# Patient Record
Sex: Male | Born: 1955 | Race: Black or African American | Hispanic: No | Marital: Married | Smoking: Never smoker
Health system: Southern US, Community
[De-identification: ages and names within clinical notes are randomized; demographics above are authoritative.]

## PROBLEM LIST (undated history)

## (undated) ENCOUNTER — Emergency Department (HOSPITAL_BASED_OUTPATIENT_CLINIC_OR_DEPARTMENT_OTHER): Admission: EM | Payer: Self-pay | Source: Home / Self Care

## (undated) DIAGNOSIS — I10 Essential (primary) hypertension: Secondary | ICD-10-CM

## (undated) DIAGNOSIS — M1611 Unilateral primary osteoarthritis, right hip: Secondary | ICD-10-CM

## (undated) DIAGNOSIS — E119 Type 2 diabetes mellitus without complications: Secondary | ICD-10-CM

## (undated) HISTORY — PX: PROSTATE BIOPSY: SHX241

## (undated) HISTORY — PX: WISDOM TOOTH EXTRACTION: SHX21

---

## 2013-07-20 DIAGNOSIS — E785 Hyperlipidemia, unspecified: Secondary | ICD-10-CM | POA: Insufficient documentation

## 2014-08-18 HISTORY — PX: LAMINECTOMY: SHX219

## 2017-03-10 DIAGNOSIS — G4733 Obstructive sleep apnea (adult) (pediatric): Secondary | ICD-10-CM | POA: Insufficient documentation

## 2021-03-27 LAB — EXTERNAL GENERIC LAB PROCEDURE: COLOGUARD: NEGATIVE

## 2021-03-27 LAB — COLOGUARD: COLOGUARD: NEGATIVE

## 2021-07-05 ENCOUNTER — Other Ambulatory Visit: Payer: Self-pay | Admitting: Urology

## 2021-07-05 DIAGNOSIS — R972 Elevated prostate specific antigen [PSA]: Secondary | ICD-10-CM

## 2021-08-17 ENCOUNTER — Ambulatory Visit
Admission: RE | Admit: 2021-08-17 | Discharge: 2021-08-17 | Disposition: A | Discharge status: Home / Self Care | Payer: Self-pay | Source: Ambulatory Visit | Attending: Urology | Admitting: Urology

## 2021-08-17 ENCOUNTER — Other Ambulatory Visit: Payer: Self-pay

## 2021-08-17 DIAGNOSIS — R972 Elevated prostate specific antigen [PSA]: Secondary | ICD-10-CM

## 2021-08-17 IMAGING — MR MR PROSTATE WO/W CM
12 series · 48 of 48 positions shown · IV contrast (multihance)
Comparison: None

CLINICAL DATA: A 65-year-old male presents for evaluation of
elevated PSA to

EXAM:
MR PROSTATE WITHOUT AND WITH CONTRAST
TECHNIQUE: Multiplanar multisequence MRI images were obtained of the pelvis
centered about the prostate. Pre and post contrast images were
obtained.
CONTRAST:  20mL MULTIHANCE GADOBENATE DIMEGLUMINE 529 MG/ML IV SOLN

[Series 3: T2 · coronal · 3.0mm · 0.56mm/px · 1 of 27 slices shown (1 of 3)]
[im 1/27]
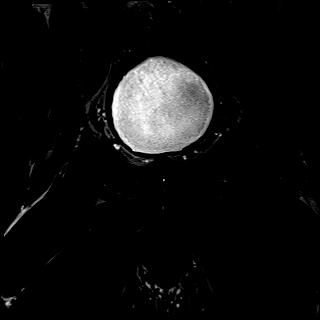

[Series 4: T1 · axial · 5.0mm · 1.25mm/px · 1 of 80 slices shown]
[im 1/80]
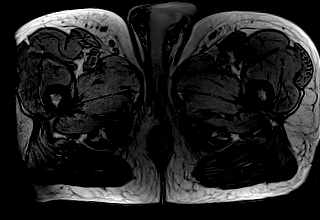

[Series 5: DWI · axial · 3.0mm · 1.93mm/px · 1 of 81 slices shown (1 of 3)]
[im 1/81]
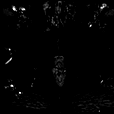

[Series 6: DWI · axial · 3.0mm · 1.93mm/px · 1 of 27 slices shown (2 of 3)]
[im 1/27]
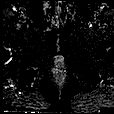

[Series 7: DWI · axial · 3.0mm · 1.93mm/px · 1 of 27 slices shown (3 of 3)]
[im 1/27]
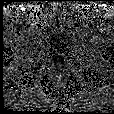

[Series 8: T2 · axial · 3.0mm · 0.62mm/px · 1 of 28 slices shown (2 of 3)]
[im 1/28]
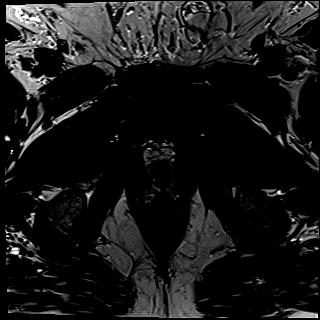

[Series 9: T2 · axial · 1.0mm · 1.04mm/px · z∈[+56,+134]mm · 2 of 80 slices shown (3 of 3)]
[im 1/80]
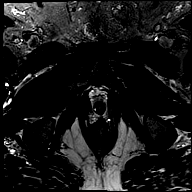
[im 80/80]
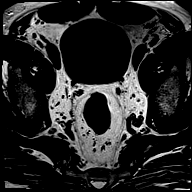

[Series 10: pre t1_twist_tra_dyn · axial · non-contrast · 3.5mm · 0.94mm/px · 1 of 24 slices shown]
[im 1/24]
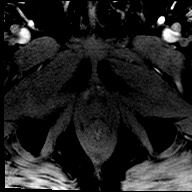

[Series 11: post t1_twist_tra_dyn-copy center · axial · non-contrast · 3.5mm · 0.94mm/px · z∈[+54,+134]mm · 18 of 720 slices shown]
[im 1/720]
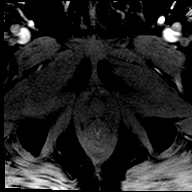
[im 43/720]
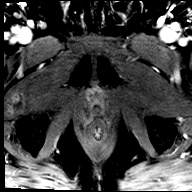
[im 85/720]
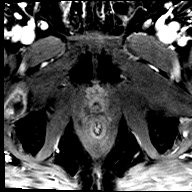
[im 127/720]
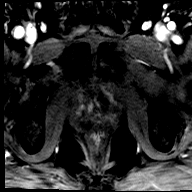
[im 170/720]
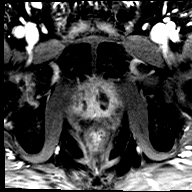
[im 212/720]
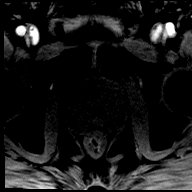
[im 254/720]
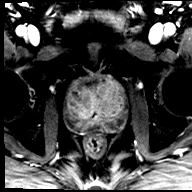
[im 297/720]
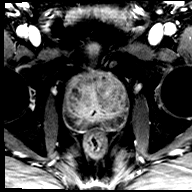
[im 339/720]
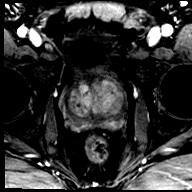
[im 381/720]
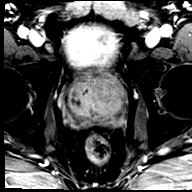
[im 423/720]
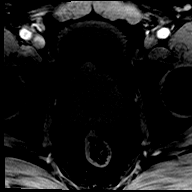
[im 466/720]
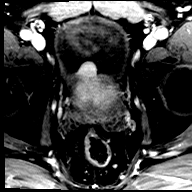
[im 508/720]
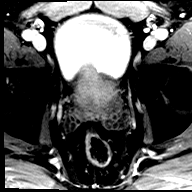
[im 550/720]
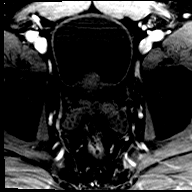
[im 593/720]
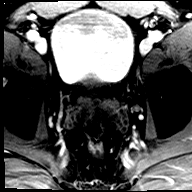
[im 635/720]
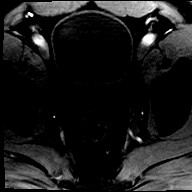
[im 677/720]
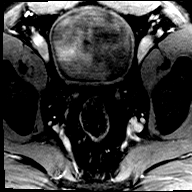
[im 720/720]
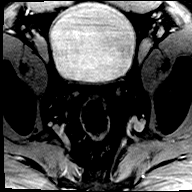

[Series 12: post t1_twist_tra_dyn-copy cent_sub · axial · 3.5mm · 0.94mm/px · z∈[+54,+134]mm · 17 of 696 slices shown]
[im 1/696]
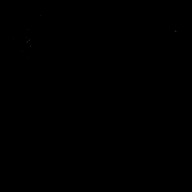
[im 44/696]
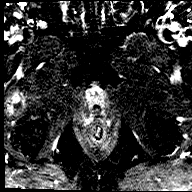
[im 87/696]
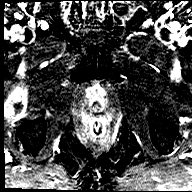
[im 131/696]
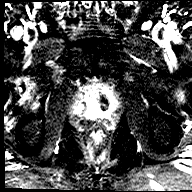
[im 174/696]
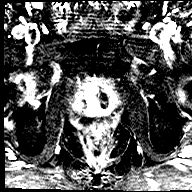
[im 218/696]
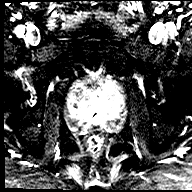
[im 261/696]
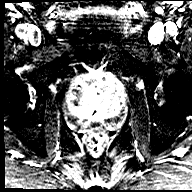
[im 305/696]
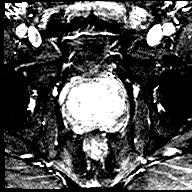
[im 348/696]
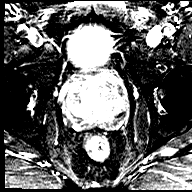
[im 391/696]
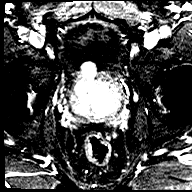
[im 435/696]
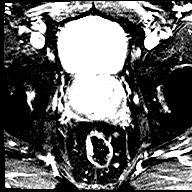
[im 478/696]
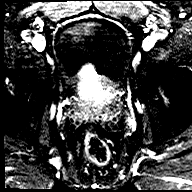
[im 522/696]
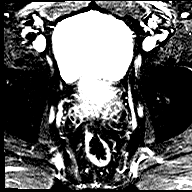
[im 565/696]
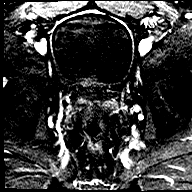
[im 609/696]
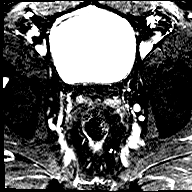
[im 652/696]
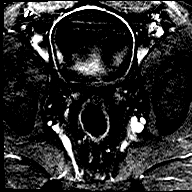
[im 696/696]
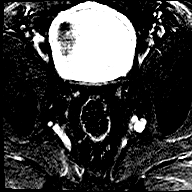

[Series 13: t1_vibe_dixon_tra_f · axial · 2.5mm · 0.91mm/px · z∈[+15,+212]mm · 2 of 80 slices shown]
[im 1/80]
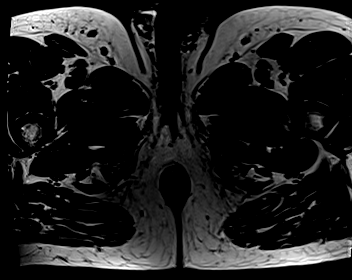
[im 80/80]
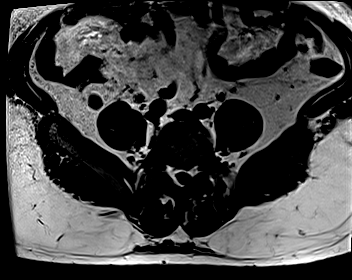

[Series 14: t1_vibe_dixon_tra_w · axial · 2.5mm · 0.91mm/px · z∈[+15,+212]mm · 2 of 80 slices shown]
[im 1/80]
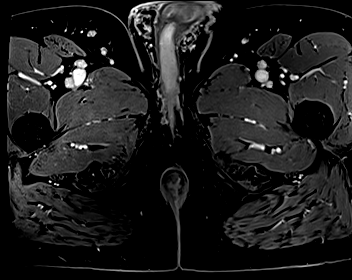
[im 80/80]
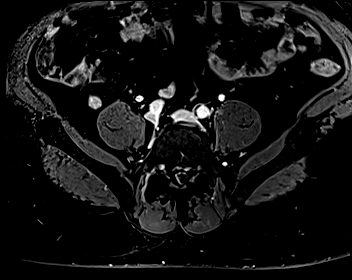

[48 of 48 positions shown; findings below may reference images not displayed]

FINDINGS: Prostate: Signs of BPH in prostatomegaly.

Transitional zone: BPH nodularity throughout the transitional zone.
No high-risk lesion.

Peripheral zone: Linear and wedge-shaped areas of T2 hypointensity
throughout the transitional zone. Tiny area of T2 hypointensity
correspondingly dark on the ADC map at 4 mm size showing signal on
"high B value" restricted diffusion images (image [DATE]). This is in
the LEFT posterolateral mid gland to base peripheral zone. PIRADS
category 4

Volume: 6.0 x 4.9 x 6.3 cm. Calculated volume from DynaCAD at 99 cc.

Transcapsular spread:  Absent

Seminal vesicle involvement: Absent

Neurovascular bundle involvement: Absent

Pelvic adenopathy: Absent

Bone metastasis: Absent

Other findings: None
IMPRESSION: 1. 4 mm area of T2 hypointensity correspondingly dark on the ADC map
at the 4 mm size with associated restricted diffusion. PIRADS
category 4 lesion.
2. No evidence of extracapsular extension, seminal vesicle invasion,
or metastatic disease.

## 2021-08-17 MED ORDER — GADOBENATE DIMEGLUMINE 529 MG/ML IV SOLN
20.0000 mL | Freq: Once | INTRAVENOUS | Status: AC | PRN
  Administered 2021-08-17: 20 mL via INTRAVENOUS

## 2023-01-20 ENCOUNTER — Emergency Department (HOSPITAL_BASED_OUTPATIENT_CLINIC_OR_DEPARTMENT_OTHER)
Admission: EM | Admit: 2023-01-20 | Discharge: 2023-01-20 | Disposition: A | Discharge status: Home / Self Care | Payer: 59 | Attending: Emergency Medicine | Admitting: Emergency Medicine

## 2023-01-20 ENCOUNTER — Other Ambulatory Visit: Payer: Self-pay

## 2023-01-20 ENCOUNTER — Encounter (HOSPITAL_BASED_OUTPATIENT_CLINIC_OR_DEPARTMENT_OTHER): Payer: Self-pay

## 2023-01-20 DIAGNOSIS — M545 Low back pain, unspecified: Secondary | ICD-10-CM

## 2023-01-20 DIAGNOSIS — I10 Essential (primary) hypertension: Secondary | ICD-10-CM | POA: Insufficient documentation

## 2023-01-20 HISTORY — DX: Essential (primary) hypertension: I10

## 2023-01-20 MED ORDER — PREDNISONE 10 MG (21) PO TBPK: ORAL_TABLET | Freq: Every day | ORAL | 0 refills | Status: DC

## 2023-01-20 MED ORDER — KETOROLAC TROMETHAMINE 30 MG/ML IJ SOLN
30.0000 mg | Freq: Once | INTRAMUSCULAR | Status: AC
  Administered 2023-01-20: 30 mg via INTRAMUSCULAR
  Filled 2023-01-20: qty 1

## 2023-01-20 MED ORDER — LIDOCAINE 5 % EX PTCH
1.0000 | MEDICATED_PATCH | CUTANEOUS | Status: DC
  Administered 2023-01-20: 1 via TRANSDERMAL
  Filled 2023-01-20: qty 1

## 2023-01-20 MED ORDER — LIDOCAINE 5 % EX PTCH: 1.0000 | MEDICATED_PATCH | CUTANEOUS | 0 refills | Status: DC

## 2023-01-20 MED ORDER — METHOCARBAMOL 500 MG PO TABS: 500.0000 mg | ORAL_TABLET | Freq: Two times a day (BID) | ORAL | 0 refills | Status: DC

## 2023-01-20 NOTE — ED Triage Notes (Signed)
Patient here POV from Home.  Endorses Back for about 5 Days. Left Lower Back mainly. No Known Trauma. No Dysuria. No Hematuria. Radiates to left leg.   NAD Noted during Triage. A&Ox4. GCS 15. Ambulatory.

## 2023-01-20 NOTE — Discharge Instructions (Addendum)
I have given you the information for our neurosurgery specialists. As we discussed, I do believe that a multimodal approach will be the most effective for treating her back pain.  Topical medicine such as Voltaren gel, Lidoderm patch, muscle relaxers like Robaxin, prednisone and Tylenol and ibuprofen.  It might not be unreasonable to take a Pepcid 20 mg once daily or twice daily to offer you some stomach protection with you being on prednisone and taking NSAIDs.  Please use Tylenol or ibuprofen for pain.  You may use 600 mg ibuprofen every 6 hours or 1000 mg of Tylenol every 6 hours.  You may choose to alternate between the 2.  This would be most effective.  Not to exceed 4 g of Tylenol within 24 hours.  Not to exceed 3200 mg ibuprofen 24 hours.   Please return the emergency room for any new or concerning symptoms.

## 2023-01-20 NOTE — ED Provider Notes (Signed)
[place] EMERGENCY DEPARTMENT AT [facility] Provider Note   CSN: [id number] Arrival date & time: 01/20/23  1210     History  Chief Complaint  Patient presents with   Back Pain    Billy Dalton is a 67 y.o. male.   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  No falls or trauma he states that it seems to be worse when he was bending over and walking.  He states the pain is better when he sits down.  He denies any numbness or weakness in his leg other than some weakness related to pain.  Denies any chest pain or difficulty breathing no calf swelling or pain.     Home Medications Prior to Admission medications   Medication Sig Start Date End Date Taking? Authorizing Provider  lidocaine (LIDODERM) 5 % Place 1 patch onto the skin daily. Remove & Discard patch within 12 hours or as directed by MD 01/20/23  Yes Fitzpatrick Alberico, Rodrigo Ran, PA  methocarbamol (ROBAXIN) 500 MG tablet Take 1 tablet (500 mg total) by mouth 2 (two) times daily. 01/20/23  Yes Asah Lamay S, PA  predniSONE (STERAPRED UNI-PAK 21 TAB) 10 MG (21) TBPK tablet Take by mouth daily. Take 6 tabs by mouth daily  for 2 days, then 5 tabs for 2 days, then 4 tabs for 2 days, then 3 tabs for 2 days, 2 tabs for 2 days, then 1 tab by mouth daily for 2 days 01/20/23  Yes Solon Augusta S, PA      Allergies    Patient has no known allergies.    Review of Systems   Review of Systems  Musculoskeletal:  Positive for back pain.    Physical Exam Updated  Vital Signs BP (!) 144/68 (BP Location: Right Arm)   Pulse 79   Temp 97.7 F (36.5 C)   Resp 18   Ht 5\' 8"  (1.727 m)   Wt 101.6 kg   SpO2 98%   BMI 34.06 kg/m  Physical Exam Vitals and nursing note reviewed.  Constitutional:      General: He is not in acute distress. HENT:     Head: Normocephalic and atraumatic.     Nose: Nose normal.     Mouth/Throat:     Mouth: Mucous membranes are moist.  Eyes:     General: No scleral icterus. Cardiovascular:     Rate and Rhythm: Normal rate and regular rhythm.     Pulses: Normal pulses.     Heart sounds: Normal heart sounds.     Comments: Pulses 2+ in LLE Pulmonary:     Effort: Pulmonary effort is normal. No respiratory distress.     Breath sounds: No wheezing.  Abdominal:     Palpations: Abdomen is soft.     Tenderness: There is no abdominal tenderness. There is no guarding or rebound.  Musculoskeletal:     Cervical back: Normal range of motion.     Right lower leg:  No edema.     Left lower leg: No edema.     Comments: No midline C, T, L-spine tenderness there is some muscular tenderness of the left paravertebral musculature.  Negative straight leg raise  Skin:    General: Skin is warm and dry.     Capillary Refill: Capillary refill takes less than 2 seconds.  Neurological:     Mental Status: He is alert. Mental status is at baseline.  Psychiatric:        Mood and Affect: Mood normal.        Behavior: Behavior normal.     ED Results / Procedures / Treatments   Labs (all labs ordered are listed, but only abnormal results are displayed) Labs Reviewed - No data to display  EKG None  Radiology No results found.  Procedures Procedures    Medications Ordered in ED Medications  lidocaine (LIDODERM) 5 % 1 patch (has no administration in time range)  ketorolac (TORADOL) 30 MG/ML injection 30 mg (has no administration in time range)    ED Course/ Medical Decision Making/ A&P Clinical Course as of 01/20/23 1439  Tue  Jan 20, 2023  1411 Started may 31st. Has hx of similar issues.  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  No falls or trauma he states that it seems to be worse when he was bending over and walking.  He states the pain is better when he sits down.  He denies any numbness or weakness in his leg other than some weakness related to pain.  Denies any chest pain or difficulty breathing no calf swelling or pain.  Broad differential for back pain considered includes malignancy, disc herniation, spinal epidural abscess, spinal fracture, cauda equina, pyelonephritis, kidney stone, AAA, AD, pancreatitis, PE and PTX.   History without symptoms of urinary or stool retention or incontinence, neurologic changes such as sensation change or weakness lower extremities, coagulopathy or blood thinner use, is not elderly or with history of osteoporosis, denies any history of cancer, fever, IV drug use, weight changes (unexplained), or prolonged steroid use.   Physical exam most consistent with sciatic nerve pain vs muscle issue.  Doubt cauda equina d/t lack of saddle anesthesia/bowel or bladder incontinence or urinary retention, normal gait and reassuring physical examination without neurologic deficits.   History is not supportive of kidney stone, AAA, AD, pancreatitis, PE or PTX. Patient has no CVA tenderness or urinary sx to suggest  pyelonephritis or kidney stone.   Will manage patient conservatively at this time. NSAIDs, back exercises/stretches, heat therapy and follow up with PCP if symptoms do not resolve in 3-4 weeks. Patient offered muscle relaxer for comfort at night. Counseled on need to return to ED  for fever, worsening or concerning symptoms. Patient agreeable to plan and states understanding of follow up plans and return precautions.      Vitals WNL at time of discharge and patient is uncomfortable but in no acute distress   Offered Toradol shot -- given   Will discharge home.  Neurosurgery contact information given.   Final Clinical Impression(s) / ED Diagnoses Final diagnoses:  Left low back pain, unspecified chronicity, unspecified whether sciatica present    Rx / DC Orders ED Discharge Orders          Ordered    lidocaine (LIDODERM) 5 %  Every 24 hours        01/20/23 1433    methocarbamol (ROBAXIN) 500 MG tablet  2 times daily        01/20/23 1433    predniSONE (STERAPRED UNI-PAK 21 TAB) 10 MG (21) TBPK tablet  Daily        01/20/23 1433              Gailen Shelter, [place] 01/20/23 1440    Elayne Snare K, DO 01/20/23 1554

## 2023-10-01 ENCOUNTER — Ambulatory Visit (INDEPENDENT_AMBULATORY_CARE_PROVIDER_SITE_OTHER): Payer: PRIVATE HEALTH INSURANCE

## 2023-10-01 ENCOUNTER — Encounter: Payer: Self-pay | Admitting: Orthopaedic Surgery

## 2023-10-01 ENCOUNTER — Ambulatory Visit (INDEPENDENT_AMBULATORY_CARE_PROVIDER_SITE_OTHER): Payer: Self-pay | Admitting: Orthopaedic Surgery

## 2023-10-01 DIAGNOSIS — M25551 Pain in right hip: Secondary | ICD-10-CM

## 2023-10-01 DIAGNOSIS — M1611 Unilateral primary osteoarthritis, right hip: Secondary | ICD-10-CM

## 2023-10-01 NOTE — Progress Notes (Signed)
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  He is having a harder time with his ADLs such as putting his shoes and socks on he walks a significant limp.  It is now affecting his posture.  Another cardiologist is referred to me to discuss hip replacement surgery.  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  He does report lateral right hip pain as well as groin pain.  He does report that he is newly diagnosed diabetic but has a hemoglobin A1c of below 7.  He is work on weight loss and activity modification and has lost about 10 to 15 pounds recently.  He does have high blood pressure.  He has a history of a cardiac cath as well.  He currently denies any headache, chest pain, shortness of breath, fever, chills, nausea, vomiting.  He walks with a Trendelenburg gait.  He has a significant limp to his gait.  His right hip has significant limitations with internal and external rotation with stiffness as well as pain in the groin.  His left hip moves normally and smoothly.  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  There is flattening of the femoral head with superior lateral joint space narrowing and osteophytes around the right hip.  The left hip just shows some mild arthritis.  We had a long and thorough discussion about his right hip.  We have recommended a total hip arthroplasty through an anterior approach.  I showed him a hip replacement model and a handout about hip replacement surgery.  We discussed what to expect from an intraoperative and postoperative standpoint.  We discussed the risk and benefits of the surgery in detail.  He does wish to get on the schedule soon.  He understands that we will let him see patients again and perform procedures in as well as 2 to 4 weeks depending on his recovery.  All questions and  concerns were answered and addressed.  Will work on getting him scheduled for a right total hip replacement.

## 2023-10-05 ENCOUNTER — Telehealth: Payer: Self-pay

## 2023-10-05 NOTE — Telephone Encounter (Signed)
 I returned patient's call to discuss scheduling THA.  Left voice mail message for return call.

## 2023-10-08 ENCOUNTER — Other Ambulatory Visit: Payer: Self-pay

## 2023-10-12 ENCOUNTER — Ambulatory Visit: Payer: PRIVATE HEALTH INSURANCE | Admitting: Urgent Care

## 2023-10-12 ENCOUNTER — Ambulatory Visit: Payer: PRIVATE HEALTH INSURANCE | Admitting: Orthopaedic Surgery

## 2023-10-15 NOTE — Pre-Procedure Instructions (Signed)
 Surgical Instructions   Your procedure is scheduled on October 22, 2023. Report to [facility] Main Entrance "A" at 8:00 A.M., then check in with the Admitting office. Any questions or running late day of surgery: call [phone]  Questions prior to your surgery date: call [phone], Monday-Friday, 8am-4pm. If you experience any cold or flu symptoms such as cough, fever, chills, shortness of breath, etc. between now and your scheduled surgery, please notify us at the above number.     Remember:  Do not eat after midnight the night before your surgery  You may drink clear liquids until 7:00 AM the morning of your surgery.   Clear liquids allowed are: Water, Non-Citrus Juices (without pulp), Carbonated Beverages, Clear Tea (no milk, honey, etc.), Black Coffee Only (NO MILK, CREAM OR POWDERED CREAMER of any kind), and Gatorade.  Patient Instructions  The night before surgery:  No food after midnight. ONLY clear liquids after midnight  The day of surgery (if you have diabetes): Drink ONE (1) 12 oz G2 given to you in your pre admission testing appointment by 7:00 AM the morning of surgery. Drink in one sitting. Do not sip.  This drink was given to you during your hospital  pre-op appointment visit.  Nothing else to drink after completing the  12 oz bottle of G2.         If you have questions, please contact your surgeon's office.   Take these medicines the morning of surgery with A SIP OF WATER: metoprolol succinate (TOPROL-XL)    Follow your surgeon's instructions on when to stop Aspirin.  If no instructions were given by your surgeon then you will need to call the office to get those instructions.     One week prior to surgery, STOP taking any Aleve, Naproxen, Ibuprofen, Motrin, Advil, Goody's, BC's, all herbal medications, fish oil, and non-prescription vitamins.   WHAT DO I DO ABOUT MY DIABETES MEDICATION?   Do not take metFORMIN (GLUCOPHAGE-XR) the morning of  surgery.   HOW TO MANAGE YOUR DIABETES BEFORE AND AFTER SURGERY  Why is it important to control my blood sugar before and after surgery? Improving blood sugar levels before and after surgery helps healing and can limit problems. A way of improving blood sugar control is eating a healthy diet by:  Eating less sugar and carbohydrates  Increasing activity/exercise  Talking with your doctor about reaching your blood sugar goals High blood sugars (greater than 180 mg/dL) can raise your risk of infections and slow your recovery, so you will need to focus on controlling your diabetes during the weeks before surgery. Make sure that the doctor who takes care of your diabetes knows about your planned surgery including the date and location.  How do I manage my blood sugar before surgery? Check your blood sugar at least 4 times a day, starting 2 days before surgery, to make sure that the level is not too high or low.  Check your blood sugar the morning of your surgery when you wake up and every 2 hours until you get to the Short Stay unit.  If your blood sugar is less than 70 mg/dL, you will need to treat for low blood sugar: Do not take insulin. Treat a low blood sugar (less than 70 mg/dL) with  cup of clear juice (cranberry or apple), 4 glucose tablets, OR glucose gel. Recheck blood sugar in 15 minutes after treatment (to make sure it is greater than 70 mg/dL). If your blood sugar is  not greater than 70 mg/dL on recheck, call [phone] for further instructions. Report your blood sugar to the short stay nurse when you get to Short Stay.  If you are admitted to the hospital after surgery: Your blood sugar will be checked by the staff and you will probably be given insulin after surgery (instead of oral diabetes medicines) to make sure you have good blood sugar levels. The goal for blood sugar control after surgery is 80-180 mg/dL.                        Do NOT Smoke (Tobacco/Vaping) for 24  hours prior to your procedure.  If you use a CPAP at night, you may bring your mask/headgear for your overnight stay.   You will be asked to remove any contacts, glasses, piercing's, hearing aid's, dentures/partials prior to surgery. Please bring cases for these items if needed.    Patients discharged the day of surgery will not be allowed to drive home, and someone needs to stay with them for 24 hours.  SURGICAL WAITING ROOM VISITATION Patients may have no more than 2 support people in the waiting area - these visitors may rotate.   Pre-op nurse will coordinate an appropriate time for 1 ADULT support person, who may not rotate, to accompany patient in pre-op.  Children under the age of 51 must have an adult with them who is not the patient and must remain in the main waiting area with an adult.  If the patient needs to stay at the hospital during part of their recovery, the visitor guidelines for inpatient rooms apply.  Please refer to the [facility] website for the visitor guidelines for any additional information.   If you received a COVID test during your pre-op visit  it is requested that you wear a mask when out in public, stay away from anyone that may not be feeling well and notify your surgeon if you develop symptoms. If you have been in contact with anyone that has tested positive in the last 10 days please notify you surgeon.      Pre-operative 5 CHG Bathing Instructions   You can play a key role in reducing the risk of infection after surgery. Your skin needs to be as free of germs as possible. You can reduce the number of germs on your skin by washing with CHG (chlorhexidine gluconate) soap before surgery. CHG is an antiseptic soap that kills germs and continues to kill germs even after washing.   DO NOT use if you have an allergy to chlorhexidine/CHG or antibacterial soaps. If your skin becomes reddened or irritated, stop using the CHG and notify one of our RNs at  [phone].   Please shower with the CHG soap starting 4 days before surgery using the following schedule:     Please keep in mind the following:  DO NOT shave, including legs and underarms, starting the day of your first shower.   You may shave your face at any point before/day of surgery.  Place clean sheets on your bed the day you start using CHG soap. Use a clean washcloth (not used since being washed) for each shower. DO NOT sleep with pets once you start using the CHG.   CHG Shower Instructions:  Wash your face and private area with normal soap. If you choose to wash your hair, wash first with your normal shampoo.  After you use shampoo/soap, rinse your hair and body thoroughly to  remove shampoo/soap residue.  Turn the water OFF and apply about 3 tablespoons (45 ml) of CHG soap to a CLEAN washcloth.  Apply CHG soap ONLY FROM YOUR NECK DOWN TO YOUR TOES (washing for 3-5 minutes)  DO NOT use CHG soap on face, private areas, open wounds, or sores.  Pay special attention to the area where your surgery is being performed.  If you are having back surgery, having someone wash your back for you may be helpful. Wait 2 minutes after CHG soap is applied, then you may rinse off the CHG soap.  Pat dry with a clean towel  Put on clean clothes/pajamas   If you choose to wear lotion, please use ONLY the CHG-compatible lotions that are listed below.  Additional instructions for the day of surgery: DO NOT APPLY any lotions, deodorants, cologne, or perfumes.   Do not bring valuables to the hospital. [facility] is not responsible for any belongings/valuables. Do not wear nail polish, gel polish, artificial nails, or any other type of covering on natural nails (fingers and toes) Do not wear jewelry or makeup Put on clean/comfortable clothes.  Please brush your teeth.  Ask your nurse before applying any prescription medications to the skin.     CHG Compatible Lotions   Aveeno Moisturizing  lotion  Cetaphil Moisturizing Cream  Cetaphil Moisturizing Lotion  Clairol Herbal Essence Moisturizing Lotion, Dry Skin  Clairol Herbal Essence Moisturizing Lotion, Extra Dry Skin  Clairol Herbal Essence Moisturizing Lotion, Normal Skin  Curel Age Defying Therapeutic Moisturizing Lotion with Alpha Hydroxy  Curel Extreme Care Body Lotion  Curel Soothing Hands Moisturizing Hand Lotion  Curel Therapeutic Moisturizing Cream, Fragrance-Free  Curel Therapeutic Moisturizing Lotion, Fragrance-Free  Curel Therapeutic Moisturizing Lotion, Original Formula  Eucerin Daily Replenishing Lotion  Eucerin Dry Skin Therapy Plus Alpha Hydroxy Crme  Eucerin Dry Skin Therapy Plus Alpha Hydroxy Lotion  Eucerin Original Crme  Eucerin Original Lotion  Eucerin Plus Crme Eucerin Plus Lotion  Eucerin TriLipid Replenishing Lotion  Keri Anti-Bacterial Hand Lotion  Keri Deep Conditioning Original Lotion Dry Skin Formula Softly Scented  Keri Deep Conditioning Original Lotion, Fragrance Free Sensitive Skin Formula  Keri Lotion Fast Absorbing Fragrance Free Sensitive Skin Formula  Keri Lotion Fast Absorbing Softly Scented Dry Skin Formula  Keri Original Lotion  Keri Skin Renewal Lotion Keri Silky Smooth Lotion  Keri Silky Smooth Sensitive Skin Lotion  Nivea Body Creamy Conditioning Oil  Nivea Body Extra Enriched Lotion  Nivea Body Original Lotion  Nivea Body Sheer Moisturizing Lotion Nivea Crme  Nivea Skin Firming Lotion  NutraDerm 30 Skin Lotion  NutraDerm Skin Lotion  NutraDerm Therapeutic Skin Cream  NutraDerm Therapeutic Skin Lotion  ProShield Protective Hand Cream  Provon moisturizing lotion  Please read over the following fact sheets that you were given.

## 2023-10-16 ENCOUNTER — Encounter (HOSPITAL_COMMUNITY): Payer: Self-pay

## 2023-10-16 ENCOUNTER — Encounter (HOSPITAL_COMMUNITY)
Admission: RE | Admit: 2023-10-16 | Discharge: 2023-10-16 | Disposition: A | Discharge status: Home / Self Care | Payer: PRIVATE HEALTH INSURANCE | Source: Ambulatory Visit | Attending: Orthopaedic Surgery | Admitting: Orthopaedic Surgery

## 2023-10-16 ENCOUNTER — Other Ambulatory Visit: Payer: Self-pay

## 2023-10-16 VITALS — BP 146/80 | HR 73 | Temp 98.5°F | Resp 18 | Ht 68.5 in | Wt 224.0 lb

## 2023-10-16 DIAGNOSIS — G4733 Obstructive sleep apnea (adult) (pediatric): Secondary | ICD-10-CM | POA: Diagnosis not present

## 2023-10-16 DIAGNOSIS — I2510 Atherosclerotic heart disease of native coronary artery without angina pectoris: Secondary | ICD-10-CM | POA: Diagnosis not present

## 2023-10-16 DIAGNOSIS — I10 Essential (primary) hypertension: Secondary | ICD-10-CM | POA: Diagnosis not present

## 2023-10-16 DIAGNOSIS — M1611 Unilateral primary osteoarthritis, right hip: Secondary | ICD-10-CM | POA: Diagnosis not present

## 2023-10-16 DIAGNOSIS — Z01818 Encounter for other preprocedural examination: Secondary | ICD-10-CM | POA: Insufficient documentation

## 2023-10-16 DIAGNOSIS — E119 Type 2 diabetes mellitus without complications: Secondary | ICD-10-CM | POA: Diagnosis not present

## 2023-10-16 DIAGNOSIS — Z8249 Family history of ischemic heart disease and other diseases of the circulatory system: Secondary | ICD-10-CM | POA: Insufficient documentation

## 2023-10-16 HISTORY — DX: Type 2 diabetes mellitus without complications: E11.9

## 2023-10-16 HISTORY — DX: Unilateral primary osteoarthritis, right hip: M16.11

## 2023-10-16 LAB — BASIC METABOLIC PANEL
Anion gap: 8 (ref 5–15)
BUN: 11 mg/dL (ref 8–23)
CO2: 24 mmol/L (ref 22–32)
Calcium: 9 mg/dL (ref 8.9–10.3)
Chloride: 105 mmol/L (ref 98–111)
Creatinine, Ser: 0.91 mg/dL (ref 0.61–1.24)
GFR, Estimated: >60 mL/min (ref >60)
Glucose, Bld: 117 mg/dL — ABNORMAL HIGH (ref 70–99)
Potassium: 3.5 mmol/L (ref 3.5–5.1)
Sodium: 137 mmol/L (ref 135–145)

## 2023-10-16 LAB — SURGICAL PCR SCREEN
MRSA, PCR: POSITIVE — AB
Staphylococcus aureus: POSITIVE — AB

## 2023-10-16 LAB — GLUCOSE, CAPILLARY: Glucose-Capillary: 110 mg/dL — ABNORMAL HIGH (ref 70–99)

## 2023-10-16 LAB — CBC
HCT: 44.2 % (ref 39.0–52.0)
Hemoglobin: 14.4 g/dL (ref 13.0–17.0)
MCH: 26.8 pg (ref 26.0–34.0)
MCHC: 32.6 g/dL (ref 30.0–36.0)
MCV: 82.3 fL (ref 80.0–100.0)
Platelets: 300 10*3/uL (ref 150–400)
RBC: 5.37 MIL/uL (ref 4.22–5.81)
RDW: 14.3 % (ref 11.5–15.5)
WBC: 5.4 10*3/uL (ref 4.0–10.5)
nRBC: 0 % (ref 0.0–0.2)

## 2023-10-16 LAB — HEMOGLOBIN A1C
Hgb A1c MFr Bld: 6.9 % — ABNORMAL HIGH (ref 4.8–5.6)
Mean Plasma Glucose: 151.33 mg/dL

## 2023-10-16 LAB — TYPE AND SCREEN
ABO/RH(D): A POS
Antibody Screen: NEGATIVE

## 2023-10-16 NOTE — Progress Notes (Signed)
 Notified Sherrie at Dr. Eliberto Ivory office of positive PCR results

## 2023-10-16 NOTE — Progress Notes (Signed)
 PCP - Dr. Caffie Damme Cardiologist - denies (pt did see Dr. Leslye Peer LOV 09-21-19 but states he was cleared by cardiology and no longer needs to follow up)  PPM/ICD - denies   Chest x-ray - 11/01/18 EKG - 10/16/23 Stress Test - 09/21/19- CE ECHO - 08/02/10- CE Cardiac Cath - 09/22/19- CE  Sleep Study - OSA+ CPAP - nightly, pt unsure of pressure settings  DM- Pt does not check CBG at home and does not know typical fasting levels  Last dose of GLP1 agonist-  n/a   Blood Thinner Instructions: n/a Aspirin Instructions: f/u with surgeon (pt states he stopped on his own, last dose 2/25  ERAS Protcol - yes PRE-SURGERY G2- given at PAT  COVID TEST- n/a   Anesthesia review: yes, pt did see Dr. Leslye Peer LOV 09-21-19 but states he was cleared by cardiology and no longer needs to follow up  Patient denies shortness of breath, fever, cough and chest pain at PAT appointment   All instructions explained to the patient, with a verbal understanding of the material. Patient agrees to go over the instructions while at home for a better understanding. The opportunity to ask questions was provided.

## 2023-10-19 NOTE — Progress Notes (Signed)
 Anesthesia Chart Review:  68 year old male with pertinent history including HTN, OSA on CPAP, and non-insulin-dependent DM2.  He was evaluated with cardiology in February 2021 due to strong family history of CAD.  He underwent an exercise stress test which was abnormal, however, this was followed by a coronary angiogram 09/22/2019 that showed mild to moderate nonobstructive disease.  Per result summary, "PLAN:  1.  Nonobstructive CAD  -Billy Dalton does not demonstrate an ischemic cause for his abnormal stress test with EKG abnormalities likely related to hypertensive blood pressure response.  The patient would benefit greatly from aggressive primary prevention with statin therapy given his distal left main calcification and proximal OM1 mild to moderate disease.  Otherwise, the patient will be discharged tomorrow morning with plans for outpatient follow-up as needed."  Blood pressure reasonably controlled at preop testing, 146/80.  Preop labs reviewed, unremarkable, DM2 well-controlled with A1c 6.9.  EKG 10/16/2023: NSR.  Rate 69.    Billy Dalton [facility] Short Stay Center/Anesthesiology Phone [phone] 10/19/2023 2:10 PM

## 2023-10-19 NOTE — Anesthesia Preprocedure Evaluation (Signed)
 Anesthesia Evaluation  Patient identified by MRN, date of birth, ID band Patient awake    Reviewed: Allergy & Precautions, NPO status , Patient's Chart, lab work & pertinent test results  History of Anesthesia Complications Negative for: history of anesthetic complications  Airway Mallampati: I  TM Distance: >3 FB Neck ROM: Full    Dental  (+) Dental Advisory Given, Caps   Pulmonary neg shortness of breath, sleep apnea and Continuous Positive Airway Pressure Ventilation , neg COPD, neg recent URI   Pulmonary exam normal breath sounds clear to auscultation       Cardiovascular hypertension (metoprolol, triamterene-HCTZ, verapamil), Pt. on home beta blockers and Pt. on medications (-) angina + CAD (nonobstructive)  (-) Past MI, (-) Cardiac Stents and (-) CABG (-) dysrhythmias  Rhythm:Regular Rate:Normal  HLD   Neuro/Psych negative neurological ROS     GI/Hepatic negative GI ROS, Neg liver ROS,,,  Endo/Other  diabetes (Hgb A1c 6.9), Well Controlled, Type 2, Oral Hypoglycemic Agents    Renal/GU negative Renal ROS     Musculoskeletal  (+) Arthritis , Osteoarthritis,    Abdominal  (+) + obese  Peds  Hematology negative hematology ROS (+) Lab Results      Component                Value               Date                      WBC                      5.4                 10/16/2023                HGB                      14.4                10/16/2023                HCT                      44.2                10/16/2023                MCV                      82.3                10/16/2023                PLT                      300                 10/16/2023              Anesthesia Other Findings   Reproductive/Obstetrics                             Anesthesia Physical Anesthesia Plan  ASA: 2  Anesthesia Plan: Spinal and MAC   Post-op Pain Management: Tylenol PO (pre-op)*   Induction:  Intravenous  PONV Risk Score and Plan: 1 and  Ondansetron, Dexamethasone and Treatment may vary due to age or medical condition  Airway Management Planned: Natural Airway and Simple Face Mask  Additional Equipment:   Intra-op Plan:   Post-operative Plan:   Informed Consent: I have reviewed the patients History and Physical, chart, labs and discussed the procedure including the risks, benefits and alternatives for the proposed anesthesia with the patient or authorized representative who has indicated his/her understanding and acceptance.     Dental advisory given  Plan Discussed with: CRNA and Anesthesiologist  Anesthesia Plan Comments: (I have discussed risks of neuraxial anesthesia including but not limited to infection, bleeding, nerve injury, back pain, headache, seizures, and failure of block. Patient denies bleeding disorders and is not currently anticoagulated. Labs have been reviewed. Risks and benefits discussed. All patient's questions answered.   Discussed with patient risks of MAC including, but not limited to, minor pain or discomfort, hearing people in the room, and possible need for backup general anesthesia. Risks for general anesthesia also discussed including, but not limited to, sore throat, hoarse voice, chipped/damaged teeth, injury to vocal cords, nausea and vomiting, allergic reactions, lung infection, heart attack, stroke, and death. All questions answered.   PAT note by Antionette Poles, PA-C: 68 year old male with pertinent history including HTN, OSA on CPAP, and non-insulin-dependent DM2.  He was evaluated with cardiology in February 2021 due to strong family history of CAD.  He underwent an exercise stress test which was abnormal, however, this was followed by a coronary angiogram 09/22/2019 that showed mild to moderate nonobstructive disease.  Per result summary, "PLAN: 1. Nonobstructive CAD  -ALERIC FROELICH does not demonstrate an ischemic cause for his abnormal  stress test with EKG abnormalities likely related to hypertensive blood pressure response. The patient would benefit greatly from aggressive primary prevention with statin therapy given his distal left main calcification and proximal OM1 mild to moderate disease. Otherwise, the patient will be discharged tomorrow morning with plans for outpatient follow-up as needed."  Blood pressure reasonably controlled at preop testing, 146/80.  Preop labs reviewed, unremarkable, DM2 well-controlled with A1c 6.9.  EKG 10/16/2023: NSR.  Rate 69.  )        Anesthesia Quick Evaluation

## 2023-10-20 ENCOUNTER — Telehealth: Payer: Self-pay

## 2023-10-20 NOTE — Telephone Encounter (Signed)
 Patient called concerning his lab results.  CB# [phone].  Please advise.  Thank you

## 2023-10-21 NOTE — Progress Notes (Signed)
 Pt made aware of surgery time change for 10/22/23 [phone], arrival 0530. Finish drink by 0430 and any clear liquids, no food after midnight, and to follow all other instructions given.

## 2023-10-21 NOTE — H&P (Signed)
 TOTAL HIP ADMISSION H&P  Patient is admitted for right total hip arthroplasty.  Subjective:  Chief Complaint: right hip pain  HPI: Billy Dalton., 68 y.o. male, has a history of pain and functional disability in the right hip(s) due to arthritis and patient has failed non-surgical conservative treatments for greater than 12 weeks to include NSAID's and/or analgesics and activity modification.  Onset of symptoms was gradual starting 1 years ago with gradually worsening course since that time.The patient noted no past surgery on the right hip(s).  Patient currently rates pain in the right hip at 10 out of 10 with activity. Patient has night pain, worsening of pain with activity and weight bearing, trendelenberg gait, pain that interfers with activities of daily living, and pain with passive range of motion. Patient has evidence of subchondral cysts, subchondral sclerosis, periarticular osteophytes, and joint space narrowing by imaging studies. This condition presents safety issues increasing the risk of falls.  There is no current active infection.  Patient Active Problem List   Diagnosis Date Noted   Unilateral primary osteoarthritis, right hip 10/01/2023   OSA on CPAP 03/10/2017   Hyperlipidemia 07/20/2013   Past Medical History:  Diagnosis Date   Diabetes mellitus without complication (HCC)    Hypertension    Osteoarthritis of right hip     Past Surgical History:  Procedure Laterality Date   LAMINECTOMY  08/18/2014   PROSTATE BIOPSY     yearly   WISDOM TOOTH EXTRACTION      No current facility-administered medications for this encounter.   Current Outpatient Medications  Medication Sig Dispense Refill Last Dose/Taking   Ascorbic Acid (VITAMIN C PO) Take 1 tablet by mouth every Monday, Tuesday, Wednesday, Thursday, and Friday.   Taking   atorvastatin (LIPITOR) 80 MG tablet Take 80 mg by mouth every evening.   Taking   metFORMIN (GLUCOPHAGE-XR) 500 MG 24 hr tablet Take 500 mg by  mouth every evening.   Taking   metoprolol succinate (TOPROL-XL) 25 MG 24 hr tablet Take 25 mg by mouth in the morning.   Taking   tadalafil (CIALIS) 10 MG tablet Take 10 mg by mouth daily as needed for erectile dysfunction.   Taking As Needed   tamsulosin (FLOMAX) 0.4 MG CAPS capsule Take 0.4 mg by mouth at bedtime.   Taking   tetrahydrozoline 0.05 % ophthalmic solution Place 1-2 drops into both eyes 3 (three) times daily as needed (dry/irritated eyes.).   Taking As Needed   triamterene-hydrochlorothiazide (MAXZIDE-25) 37.5-25 MG tablet Take 1 tablet by mouth in the morning.   Taking   verapamil (CALAN-SR) 240 MG CR tablet Take 240 mg by mouth at bedtime.   Taking   aspirin EC 81 MG tablet Take 81 mg by mouth daily. Swallow whole.      No Known Allergies  Social History   Tobacco Use   Smoking status: Never   Smokeless tobacco: Never  Substance Use Topics   Alcohol use: Not Currently    No family history on file.   Review of Systems  Objective:  Physical Exam Vitals reviewed.  Constitutional:      Appearance: Normal appearance.  HENT:     Head: Normocephalic and atraumatic.  Eyes:     Extraocular Movements: Extraocular movements intact.     Pupils: Pupils are equal, round, and reactive to light.  Cardiovascular:     Rate and Rhythm: Normal rate.  Pulmonary:     Effort: Pulmonary effort is normal.  Breath sounds: Normal breath sounds.  Abdominal:     Palpations: Abdomen is soft.  Musculoskeletal:     Cervical back: Normal range of motion and neck supple.     Right hip: Tenderness and bony tenderness present. Decreased range of motion. Decreased strength.  Neurological:     Mental Status: He is alert and oriented to person, place, and time.  Psychiatric:        Behavior: Behavior normal.     Vital signs in last 24 hours:    Labs:   Estimated body mass index is 33.56 kg/m as calculated from the following:   Height as of 10/16/23: 5' 8.5" (1.74 m).   Weight  as of 10/16/23: 101.6 kg.   Imaging Review Plain radiographs demonstrate severe degenerative joint disease of the right hip(s). The bone quality appears to be excellent for age and reported activity level.      Assessment/Plan:  End stage arthritis, right hip(s)  The patient history, physical examination, clinical judgement of the provider and imaging studies are consistent with end stage degenerative joint disease of the right hip(s) and total hip arthroplasty is deemed medically necessary. The treatment options including medical management, injection therapy, arthroscopy and arthroplasty were discussed at length. The risks and benefits of total hip arthroplasty were presented and reviewed. The risks due to aseptic loosening, infection, stiffness, dislocation/subluxation,  thromboembolic complications and other imponderables were discussed.  The patient acknowledged the explanation, agreed to proceed with the plan and consent was signed. Patient is being admitted for inpatient treatment for surgery, pain control, PT, OT, prophylactic antibiotics, VTE prophylaxis, progressive ambulation and ADL's and discharge planning.The patient is planning to be discharged home with home health services

## 2023-10-22 ENCOUNTER — Ambulatory Visit (HOSPITAL_COMMUNITY): Payer: PRIVATE HEALTH INSURANCE

## 2023-10-22 ENCOUNTER — Observation Stay (HOSPITAL_COMMUNITY): Payer: PRIVATE HEALTH INSURANCE

## 2023-10-22 ENCOUNTER — Other Ambulatory Visit: Payer: Self-pay

## 2023-10-22 ENCOUNTER — Encounter (HOSPITAL_COMMUNITY)
Admission: RE | Disposition: A | Discharge status: Home / Self Care | Payer: Self-pay | Source: Ambulatory Visit | Attending: Orthopaedic Surgery

## 2023-10-22 ENCOUNTER — Observation Stay (HOSPITAL_COMMUNITY)
Admission: RE | Admit: 2023-10-22 | Discharge: 2023-10-23 | Disposition: A | Discharge status: Home / Self Care | Payer: PRIVATE HEALTH INSURANCE | Source: Ambulatory Visit | Attending: Orthopaedic Surgery | Admitting: Orthopaedic Surgery

## 2023-10-22 ENCOUNTER — Encounter (HOSPITAL_COMMUNITY): Payer: Self-pay | Admitting: Orthopaedic Surgery

## 2023-10-22 ENCOUNTER — Ambulatory Visit (HOSPITAL_BASED_OUTPATIENT_CLINIC_OR_DEPARTMENT_OTHER): Payer: PRIVATE HEALTH INSURANCE | Admitting: Anesthesiology

## 2023-10-22 ENCOUNTER — Ambulatory Visit (HOSPITAL_COMMUNITY): Payer: PRIVATE HEALTH INSURANCE | Admitting: Physician Assistant

## 2023-10-22 DIAGNOSIS — I10 Essential (primary) hypertension: Secondary | ICD-10-CM | POA: Insufficient documentation

## 2023-10-22 DIAGNOSIS — M1611 Unilateral primary osteoarthritis, right hip: Secondary | ICD-10-CM

## 2023-10-22 DIAGNOSIS — Z7984 Long term (current) use of oral hypoglycemic drugs: Secondary | ICD-10-CM | POA: Diagnosis not present

## 2023-10-22 DIAGNOSIS — Z7982 Long term (current) use of aspirin: Secondary | ICD-10-CM | POA: Diagnosis not present

## 2023-10-22 DIAGNOSIS — Z79899 Other long term (current) drug therapy: Secondary | ICD-10-CM | POA: Diagnosis not present

## 2023-10-22 DIAGNOSIS — E119 Type 2 diabetes mellitus without complications: Secondary | ICD-10-CM | POA: Insufficient documentation

## 2023-10-22 DIAGNOSIS — Z96641 Presence of right artificial hip joint: Secondary | ICD-10-CM

## 2023-10-22 HISTORY — PX: TOTAL HIP ARTHROPLASTY: SHX124

## 2023-10-22 LAB — GLUCOSE, CAPILLARY
Glucose-Capillary: 128 mg/dL — ABNORMAL HIGH (ref 70–99)
Glucose-Capillary: 138 mg/dL — ABNORMAL HIGH (ref 70–99)

## 2023-10-22 LAB — ABO/RH: ABO/RH(D): A POS

## 2023-10-22 SURGERY — ARTHROPLASTY, HIP, TOTAL, ANTERIOR APPROACH
Anesthesia: Monitor Anesthesia Care | Site: Hip | Laterality: Right

## 2023-10-22 MED ORDER — MUPIROCIN 2 % EX OINT: 1.0000 | TOPICAL_OINTMENT | Freq: Two times a day (BID) | CUTANEOUS | 0 refills | Status: AC

## 2023-10-22 MED ORDER — PHENYLEPHRINE HCL (PRESSORS) 10 MG/ML IV SOLN
INTRAVENOUS | Status: AC
  Filled 2023-10-22: qty 1

## 2023-10-22 MED ORDER — LACTATED RINGERS IV SOLN: INTRAVENOUS | Status: DC

## 2023-10-22 MED ORDER — ACETAMINOPHEN 500 MG PO TABS: 1000.0000 mg | ORAL_TABLET | Freq: Once | ORAL | Status: AC

## 2023-10-22 MED ORDER — PANTOPRAZOLE SODIUM 40 MG PO TBEC
40.0000 mg | DELAYED_RELEASE_TABLET | Freq: Every day | ORAL | Status: DC
  Administered 2023-10-22 – 2023-10-23 (×2): 40 mg via ORAL
  Filled 2023-10-22 (×2): qty 1

## 2023-10-22 MED ORDER — CHLORHEXIDINE GLUCONATE CLOTH 2 % EX PADS
6.0000 | MEDICATED_PAD | Freq: Every day | CUTANEOUS | Status: DC
  Administered 2023-10-22 – 2023-10-23 (×2): 6 via TOPICAL

## 2023-10-22 MED ORDER — PHENYLEPHRINE 80 MCG/ML (10ML) SYRINGE FOR IV PUSH (FOR BLOOD PRESSURE SUPPORT)
PREFILLED_SYRINGE | INTRAVENOUS | Status: AC
  Filled 2023-10-22: qty 10

## 2023-10-22 MED ORDER — PHENYLEPHRINE HCL-NACL 20-0.9 MG/250ML-% IV SOLN
INTRAVENOUS | Status: DC | PRN
  Administered 2023-10-22: 30 ug/min via INTRAVENOUS

## 2023-10-22 MED ORDER — VERAPAMIL HCL ER 240 MG PO TBCR
240.0000 mg | EXTENDED_RELEASE_TABLET | Freq: Every day | ORAL | Status: DC
  Filled 2023-10-22: qty 1

## 2023-10-22 MED ORDER — CHLORHEXIDINE GLUCONATE 0.12 % MT SOLN
OROMUCOSAL | Status: AC
  Administered 2023-10-22: 15 mL via OROMUCOSAL
  Filled 2023-10-22: qty 15

## 2023-10-22 MED ORDER — TAMSULOSIN HCL 0.4 MG PO CAPS
0.4000 mg | ORAL_CAPSULE | Freq: Every day | ORAL | Status: DC
  Administered 2023-10-22: 0.4 mg via ORAL
  Filled 2023-10-22: qty 1

## 2023-10-22 MED ORDER — VANCOMYCIN HCL IN DEXTROSE 1-5 GM/200ML-% IV SOLN: 1000.0000 mg | INTRAVENOUS | Status: AC

## 2023-10-22 MED ORDER — DEXAMETHASONE SODIUM PHOSPHATE 10 MG/ML IJ SOLN
INTRAMUSCULAR | Status: AC
  Filled 2023-10-22: qty 1

## 2023-10-22 MED ORDER — CEFAZOLIN SODIUM-DEXTROSE 2-4 GM/100ML-% IV SOLN
INTRAVENOUS | Status: AC
  Filled 2023-10-22: qty 100

## 2023-10-22 MED ORDER — CHLORHEXIDINE GLUCONATE 4 % EX SOLN: 1.0000 | CUTANEOUS | 1 refills | Status: DC

## 2023-10-22 MED ORDER — PHENOL 1.4 % MT LIQD: 1.0000 | OROMUCOSAL | Status: DC | PRN

## 2023-10-22 MED ORDER — PROPOFOL 10 MG/ML IV BOLUS
INTRAVENOUS | Status: AC
  Filled 2023-10-22: qty 20

## 2023-10-22 MED ORDER — METOCLOPRAMIDE HCL 5 MG PO TABS: 5.0000 mg | ORAL_TABLET | Freq: Three times a day (TID) | ORAL | Status: DC | PRN

## 2023-10-22 MED ORDER — ONDANSETRON HCL 4 MG/2ML IJ SOLN: 4.0000 mg | Freq: Four times a day (QID) | INTRAMUSCULAR | Status: DC | PRN

## 2023-10-22 MED ORDER — METOPROLOL SUCCINATE ER 25 MG PO TB24
25.0000 mg | ORAL_TABLET | Freq: Every day | ORAL | Status: DC
  Administered 2023-10-23: 25 mg via ORAL
  Filled 2023-10-22: qty 1

## 2023-10-22 MED ORDER — PROPOFOL 500 MG/50ML IV EMUL
INTRAVENOUS | Status: DC | PRN
  Administered 2023-10-22: 30 ug/kg/min via INTRAVENOUS

## 2023-10-22 MED ORDER — ACETAMINOPHEN 500 MG PO TABS
ORAL_TABLET | ORAL | Status: AC
  Administered 2023-10-22: 1000 mg via ORAL
  Filled 2023-10-22: qty 2

## 2023-10-22 MED ORDER — METOCLOPRAMIDE HCL 5 MG/ML IJ SOLN: 5.0000 mg | Freq: Three times a day (TID) | INTRAMUSCULAR | Status: DC | PRN

## 2023-10-22 MED ORDER — CHLORHEXIDINE GLUCONATE 0.12 % MT SOLN: 15.0000 mL | Freq: Once | OROMUCOSAL | Status: AC

## 2023-10-22 MED ORDER — METHOCARBAMOL 1000 MG/10ML IJ SOLN: 500.0000 mg | Freq: Four times a day (QID) | INTRAMUSCULAR | Status: DC | PRN

## 2023-10-22 MED ORDER — OXYCODONE HCL 5 MG PO TABS: 5.0000 mg | ORAL_TABLET | Freq: Once | ORAL | Status: DC | PRN

## 2023-10-22 MED ORDER — LIDOCAINE 2% (20 MG/ML) 5 ML SYRINGE
INTRAMUSCULAR | Status: AC
  Filled 2023-10-22: qty 5

## 2023-10-22 MED ORDER — ACETAMINOPHEN 325 MG PO TABS: 325.0000 mg | ORAL_TABLET | Freq: Four times a day (QID) | ORAL | Status: DC | PRN

## 2023-10-22 MED ORDER — PROPOFOL 1000 MG/100ML IV EMUL
INTRAVENOUS | Status: AC
  Filled 2023-10-22: qty 100

## 2023-10-22 MED ORDER — ORAL CARE MOUTH RINSE: 15.0000 mL | Freq: Once | OROMUCOSAL | Status: AC

## 2023-10-22 MED ORDER — OXYCODONE HCL 5 MG PO TABS
10.0000 mg | ORAL_TABLET | ORAL | Status: DC | PRN
  Administered 2023-10-22 – 2023-10-23 (×2): 15 mg via ORAL
  Filled 2023-10-22 (×2): qty 3

## 2023-10-22 MED ORDER — SODIUM CHLORIDE 0.9 % IR SOLN
Status: DC | PRN
  Administered 2023-10-22: 3000 mL

## 2023-10-22 MED ORDER — TRANEXAMIC ACID-NACL 1000-0.7 MG/100ML-% IV SOLN
INTRAVENOUS | Status: AC
  Filled 2023-10-22: qty 100

## 2023-10-22 MED ORDER — TRANEXAMIC ACID-NACL 1000-0.7 MG/100ML-% IV SOLN
1000.0000 mg | INTRAVENOUS | Status: AC
  Administered 2023-10-22: 1000 mg via INTRAVENOUS

## 2023-10-22 MED ORDER — ATORVASTATIN CALCIUM 80 MG PO TABS
80.0000 mg | ORAL_TABLET | Freq: Every day | ORAL | Status: DC
  Administered 2023-10-22: 80 mg via ORAL
  Filled 2023-10-22 (×2): qty 1

## 2023-10-22 MED ORDER — METHOCARBAMOL 500 MG PO TABS: 500.0000 mg | ORAL_TABLET | Freq: Four times a day (QID) | ORAL | 0 refills | Status: DC | PRN

## 2023-10-22 MED ORDER — ONDANSETRON HCL 4 MG/2ML IJ SOLN
INTRAMUSCULAR | Status: AC
  Filled 2023-10-22: qty 2

## 2023-10-22 MED ORDER — MIDAZOLAM HCL 2 MG/2ML IJ SOLN
INTRAMUSCULAR | Status: AC
  Filled 2023-10-22: qty 2

## 2023-10-22 MED ORDER — OXYCODONE HCL 5 MG PO TABS
5.0000 mg | ORAL_TABLET | ORAL | Status: DC | PRN
  Administered 2023-10-22: 10 mg via ORAL
  Filled 2023-10-22: qty 2

## 2023-10-22 MED ORDER — METFORMIN HCL ER 500 MG PO TB24
500.0000 mg | ORAL_TABLET | Freq: Every evening | ORAL | Status: DC
  Administered 2023-10-22: 500 mg via ORAL
  Filled 2023-10-22: qty 1

## 2023-10-22 MED ORDER — AMISULPRIDE (ANTIEMETIC) 5 MG/2ML IV SOLN: 10.0000 mg | Freq: Once | INTRAVENOUS | Status: DC | PRN

## 2023-10-22 MED ORDER — DIPHENHYDRAMINE HCL 12.5 MG/5ML PO ELIX: 12.5000 mg | ORAL_SOLUTION | ORAL | Status: DC | PRN

## 2023-10-22 MED ORDER — BUPIVACAINE IN DEXTROSE 0.75-8.25 % IT SOLN
INTRATHECAL | Status: DC | PRN
  Administered 2023-10-22: 1.8 mL via INTRATHECAL

## 2023-10-22 MED ORDER — VANCOMYCIN HCL IN DEXTROSE 1-5 GM/200ML-% IV SOLN
INTRAVENOUS | Status: AC
  Administered 2023-10-22: 1000 mg via INTRAVENOUS
  Filled 2023-10-22: qty 200

## 2023-10-22 MED ORDER — SODIUM CHLORIDE 0.9 % IV SOLN: INTRAVENOUS | Status: DC

## 2023-10-22 MED ORDER — PHENYLEPHRINE 80 MCG/ML (10ML) SYRINGE FOR IV PUSH (FOR BLOOD PRESSURE SUPPORT)
PREFILLED_SYRINGE | INTRAVENOUS | Status: DC | PRN
Start: 2023-10-22 — End: 2023-10-22
  Administered 2023-10-22 (×2): 80 ug via INTRAVENOUS

## 2023-10-22 MED ORDER — 0.9 % SODIUM CHLORIDE (POUR BTL) OPTIME
TOPICAL | Status: DC | PRN
  Administered 2023-10-22: 1000 mL

## 2023-10-22 MED ORDER — ONDANSETRON HCL 4 MG PO TABS: 4.0000 mg | ORAL_TABLET | Freq: Four times a day (QID) | ORAL | Status: DC | PRN

## 2023-10-22 MED ORDER — METHOCARBAMOL 500 MG PO TABS
500.0000 mg | ORAL_TABLET | Freq: Four times a day (QID) | ORAL | Status: DC | PRN
  Administered 2023-10-22 – 2023-10-23 (×2): 500 mg via ORAL
  Filled 2023-10-22 (×2): qty 1

## 2023-10-22 MED ORDER — OXYCODONE HCL 5 MG PO TABS: 5.0000 mg | ORAL_TABLET | Freq: Four times a day (QID) | ORAL | 0 refills | Status: DC | PRN

## 2023-10-22 MED ORDER — ASPIRIN 81 MG PO CHEW: 81.0000 mg | CHEWABLE_TABLET | Freq: Two times a day (BID) | ORAL | 0 refills | Status: DC

## 2023-10-22 MED ORDER — MIDAZOLAM HCL 2 MG/2ML IJ SOLN
INTRAMUSCULAR | Status: DC | PRN
  Administered 2023-10-22: 2 mg via INTRAVENOUS

## 2023-10-22 MED ORDER — OXYCODONE HCL 5 MG/5ML PO SOLN: 5.0000 mg | Freq: Once | ORAL | Status: DC | PRN

## 2023-10-22 MED ORDER — PROPOFOL 10 MG/ML IV BOLUS
INTRAVENOUS | Status: DC | PRN
  Administered 2023-10-22 (×3): 20 mg via INTRAVENOUS

## 2023-10-22 MED ORDER — ALUM & MAG HYDROXIDE-SIMETH 200-200-20 MG/5ML PO SUSP: 30.0000 mL | ORAL | Status: DC | PRN

## 2023-10-22 MED ORDER — ASPIRIN 81 MG PO CHEW
81.0000 mg | CHEWABLE_TABLET | Freq: Two times a day (BID) | ORAL | Status: DC
  Administered 2023-10-22 – 2023-10-23 (×2): 81 mg via ORAL
  Filled 2023-10-22 (×2): qty 1

## 2023-10-22 MED ORDER — MUPIROCIN 2 % EX OINT
1.0000 | TOPICAL_OINTMENT | Freq: Two times a day (BID) | CUTANEOUS | Status: DC
  Administered 2023-10-22 – 2023-10-23 (×2): 1 via NASAL
  Filled 2023-10-22: qty 22

## 2023-10-22 MED ORDER — HYDROMORPHONE HCL 1 MG/ML IJ SOLN
0.5000 mg | INTRAMUSCULAR | Status: DC | PRN
Start: 2023-10-22 — End: 2023-10-23

## 2023-10-22 MED ORDER — CEFAZOLIN SODIUM-DEXTROSE 2-4 GM/100ML-% IV SOLN
2.0000 g | INTRAVENOUS | Status: AC
  Administered 2023-10-22: 2 g via INTRAVENOUS

## 2023-10-22 MED ORDER — FENTANYL CITRATE (PF) 100 MCG/2ML IJ SOLN: 25.0000 ug | INTRAMUSCULAR | Status: DC | PRN

## 2023-10-22 MED ORDER — VANCOMYCIN HCL IN DEXTROSE 1-5 GM/200ML-% IV SOLN
1000.0000 mg | Freq: Two times a day (BID) | INTRAVENOUS | Status: AC
  Administered 2023-10-22: 1000 mg via INTRAVENOUS
  Filled 2023-10-22: qty 200

## 2023-10-22 MED ORDER — POVIDONE-IODINE 10 % EX SWAB
2.0000 | Freq: Once | CUTANEOUS | Status: AC
  Administered 2023-10-22: 2 via TOPICAL

## 2023-10-22 MED ORDER — LIDOCAINE 2% (20 MG/ML) 5 ML SYRINGE
INTRAMUSCULAR | Status: DC | PRN
  Administered 2023-10-22: 20 mg via INTRAVENOUS

## 2023-10-22 MED ORDER — DEXAMETHASONE SODIUM PHOSPHATE 10 MG/ML IJ SOLN
INTRAMUSCULAR | Status: DC | PRN
  Administered 2023-10-22: 5 mg via INTRAVENOUS

## 2023-10-22 MED ORDER — TRIAMTERENE-HCTZ 37.5-25 MG PO TABS
1.0000 | ORAL_TABLET | Freq: Every day | ORAL | Status: DC
  Administered 2023-10-23: 1 via ORAL
  Filled 2023-10-22: qty 1

## 2023-10-22 MED ORDER — DOCUSATE SODIUM 100 MG PO CAPS
100.0000 mg | ORAL_CAPSULE | Freq: Two times a day (BID) | ORAL | Status: DC
  Administered 2023-10-22 – 2023-10-23 (×2): 100 mg via ORAL
  Filled 2023-10-22 (×2): qty 1

## 2023-10-22 MED ORDER — MENTHOL 3 MG MT LOZG: 1.0000 | LOZENGE | OROMUCOSAL | Status: DC | PRN

## 2023-10-22 MED ORDER — ONDANSETRON HCL 4 MG/2ML IJ SOLN
INTRAMUSCULAR | Status: DC | PRN
  Administered 2023-10-22: 4 mg via INTRAVENOUS

## 2023-10-22 SURGICAL SUPPLY — 42 items
BAG COUNTER SPONGE SURGICOUNT (BAG) ×1 IMPLANT
BENZOIN TINCTURE PRP APPL 2/3 (GAUZE/BANDAGES/DRESSINGS) ×1 IMPLANT
BLADE SAW SGTL 18X1.27X75 (BLADE) ×1 IMPLANT
COVER SURGICAL LIGHT HANDLE (MISCELLANEOUS) ×1 IMPLANT
CUP ACET PNNCL SECTR W/GRIP 56 (Hips) IMPLANT
DRAPE C-ARM 42X72 X-RAY (DRAPES) ×1 IMPLANT
DRAPE STERI IOBAN 125X83 (DRAPES) ×1 IMPLANT
DRAPE U-SHAPE 47X51 STRL (DRAPES) ×3 IMPLANT
DRSG AQUACEL AG ADV 3.5X10 (GAUZE/BANDAGES/DRESSINGS) ×1 IMPLANT
DURAPREP 26ML APPLICATOR (WOUND CARE) ×1 IMPLANT
ELECT BLADE 4.0 EZ CLEAN MEGAD (MISCELLANEOUS) ×1 IMPLANT
ELECT REM PT RETURN 9FT ADLT (ELECTROSURGICAL) ×1 IMPLANT
ELECTRODE BLDE 4.0 EZ CLN MEGD (MISCELLANEOUS) ×1 IMPLANT
ELECTRODE REM PT RTRN 9FT ADLT (ELECTROSURGICAL) ×1 IMPLANT
FACESHIELD WRAPAROUND (MASK) ×2 IMPLANT
FACESHIELD WRAPAROUND OR TEAM (MASK) ×2 IMPLANT
FEM STEM 12/14 TAPER SZ 4 HIP (Orthopedic Implant) ×1 IMPLANT
FEMORAL STEM 12/14 TPR SZ4 HIP (Orthopedic Implant) IMPLANT
GLOVE BIOGEL PI IND STRL 8 (GLOVE) ×2 IMPLANT
GLOVE ECLIPSE 8.0 STRL XLNG CF (GLOVE) ×1 IMPLANT
GLOVE ORTHO TXT STRL SZ7.5 (GLOVE) ×2 IMPLANT
GOWN STRL REUS W/ TWL LRG LVL3 (GOWN DISPOSABLE) ×2 IMPLANT
GOWN STRL REUS W/ TWL XL LVL3 (GOWN DISPOSABLE) ×2 IMPLANT
HEAD CERAMIC DELTA 36 PLUS 1.5 (Hips) IMPLANT
KIT BASIN OR (CUSTOM PROCEDURE TRAY) ×1 IMPLANT
KIT TURNOVER KIT B (KITS) ×1 IMPLANT
MANIFOLD NEPTUNE II (INSTRUMENTS) ×1 IMPLANT
NS IRRIG 1000ML POUR BTL (IV SOLUTION) ×1 IMPLANT
PACK TOTAL JOINT (CUSTOM PROCEDURE TRAY) ×1 IMPLANT
PAD ARMBOARD 7.5X6 YLW CONV (MISCELLANEOUS) ×1 IMPLANT
PINN SECTOR W/GRIP ACE CUP 56 (Hips) ×1 IMPLANT
PINNACLE ALTRX PLUS 4 N 36X56 (Hips) IMPLANT
SET HNDPC FAN SPRY TIP SCT (DISPOSABLE) ×1 IMPLANT
STRIP CLOSURE SKIN 1/2X4 (GAUZE/BANDAGES/DRESSINGS) ×2 IMPLANT
SUT ETHIBOND NAB CT1 #1 30IN (SUTURE) ×1 IMPLANT
SUT VIC AB 0 CT1 27XBRD ANBCTR (SUTURE) ×1 IMPLANT
SUT VIC AB 1 CT1 27XBRD ANBCTR (SUTURE) ×1 IMPLANT
SUT VIC AB 2-0 CT1 TAPERPNT 27 (SUTURE) ×1 IMPLANT
TOWEL GREEN STERILE (TOWEL DISPOSABLE) ×1 IMPLANT
TOWEL GREEN STERILE FF (TOWEL DISPOSABLE) ×1 IMPLANT
TRAY FOLEY W/BAG SLVR 16FR ST (SET/KITS/TRAYS/PACK) IMPLANT
WATER STERILE IRR 1000ML POUR (IV SOLUTION) ×2 IMPLANT

## 2023-10-22 NOTE — Interval H&P Note (Signed)
 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  There has been no acute or interval change in his medical status.  The risks and benefits of surgery have been discussed in detail and informed consent has been obtained.  The right operative hip has been marked.  10/22/2023 7:10 AM  Billy Dalton.  has presented today for surgery, with the diagnosis of OSTEOARTHRITIS RIGHT HIP.  The various methods of treatment have been discussed with the patient and family. After consideration of risks, benefits and other options for treatment, the patient has consented to  Procedure(s): RIGHT TOTAL HIP ARTHROPLASTY ANTERIOR APPROACH (Right) as a surgical intervention.  The patient's history has been reviewed, patient examined, no change in status, stable for surgery.  I have reviewed the patient's chart and labs.  Questions were answered to the patient's satisfaction.     Kathryne Hitch

## 2023-10-22 NOTE — Anesthesia Procedure Notes (Signed)
 Spinal  Patient location during procedure: OR Start time: 10/22/2023 7:32 AM End time: 10/22/2023 7:35 AM Reason for block: surgical anesthesia Staffing Performed: anesthesiologist  Anesthesiologist: [name], MD Performed by: [name], MD Authorized by: [name], MD   Preanesthetic Checklist Completed: patient identified, IV checked, site marked, risks and benefits discussed, surgical consent, monitors and equipment checked, pre-op evaluation and timeout performed Spinal Block Patient position: sitting Prep: DuraPrep Patient monitoring: blood pressure and continuous pulse ox Approach: midline Location: L3-4 Injection technique: single-shot Needle Needle type: Pencan  Needle gauge: 24 G Needle length: 9 cm Additional Notes Risks and benefits of neuraxial anesthesia including, but not limited to, infection, bleeding, local anesthetic toxicity, headache, hypotension, back pain, block failure, etc. were discussed with the patient. The patient expressed understanding and consented to the procedure. I confirmed that the patient has no bleeding disorders and is not taking blood thinners. I confirmed the patient's last platelet count with the nurse. Monitors were applied. A time-out was performed immediately prior to the procedure. Sterile technique was used throughout the whole procedure.   1 attempt(s)

## 2023-10-22 NOTE — Evaluation (Signed)
 Physical Therapy Evaluation Patient Details Name: Billy Dalton. MRN: [id number] DOB: Sep 20, 1955 Today's Date: 10/22/2023  History of Present Illness  Pt is 68 yo [facility] for planned THA on the R. PMH: DM, HTN, OA of R hip  Clinical Impression  Pt is presenting below baseline level of functioning. Currently mod I for bed mobility and CGA for sit to stand/gait with RW. Pt has supportive family at home. Due to pt current functional status, home set up and available assistance at home no recommended skilled physical therapy services at this time on discharge from acute care hospital setting. Will continue to follow in acute setting in order to ensure that pt returns home with decreased risk for falls, injury, re-hospitalization and improved activity tolerance.          If plan is discharge home, recommend the following: Assist for transportation;Assistance with cooking/housework;Help with stairs or ramp for entrance     Equipment Recommendations Rolling walker (2 wheels)     Functional Status Assessment Patient has had a recent decline in their functional status and demonstrates the ability to make significant improvements in function in a reasonable and predictable amount of time.     Precautions / Restrictions Precautions Precautions: Fall Recall of Precautions/Restrictions: Intact Restrictions Weight Bearing Restrictions Per Provider Order: No      Mobility  Bed Mobility Overal bed mobility: Modified Independent             General bed mobility comments: increased time for supine to sitting.    Transfers Overall transfer level: Needs assistance Equipment used: Rolling walker (2 wheels) Transfers: Sit to/from Stand, Bed to chair/wheelchair/BSC Sit to Stand: Contact guard assist   Step pivot transfers: Contact guard assist       General transfer comment: CGA for safety. Pt denied dizziness/lightheadedness.    Ambulation/Gait Ambulation/Gait  assistance: Contact guard assist Gait Distance (Feet): 150 Feet Assistive device: Rolling walker (2 wheels) Gait Pattern/deviations: Step-through pattern, Decreased step length - right, Decreased step length - left, Decreased stance time - right, Antalgic Gait velocity: decreased Gait velocity interpretation: <1.31 ft/sec, indicative of household ambulator   General Gait Details: Good heel toe gait pattern with slightly antalgic gait pattern and decreased stride length overall. Pt had moderate use of UE on RW with heavy upper trap use; cueing for more use of lats during gait to off load upper traps. Cueing for heel toe gait initially. Pt was reporting increased pain during progression phase of the RLE due to incision      Balance Overall balance assessment: Mild deficits observed, not formally tested       Pertinent Vitals/Pain Pain Assessment Pain Assessment: 0-10 Pain Score: 2  Pain Location: R hip Pain Descriptors / Indicators: Aching Pain Intervention(s): Limited activity within patient's tolerance, Monitored during session    Home Living Family/patient expects to be discharged to:: Private residence Living Arrangements: Spouse/significant other;Children Available Help at Discharge: Family;Available 24 hours/day Type of Home: House Home Access: Stairs to enter Entrance Stairs-Rails: Left Entrance Stairs-Number of Steps: 2 (wall on R) Alternate Level Stairs-Number of Steps: flight Home Layout: Multi-level;Able to live on main level with bedroom/bathroom Home Equipment: Rollator (4 wheels)      Prior Function Prior Level of Function : Independent/Modified Independent;Driving;Working/employed             Mobility Comments: Pt reports that he used rollator intermittently due to hip and LB pain ADLs Comments: Ind with ADL's, IADL's  Extremity/Trunk Assessment   Upper Extremity Assessment Upper Extremity Assessment: Overall WFL for tasks assessed    Lower  Extremity Assessment Lower Extremity Assessment: RLE deficits/detail RLE Deficits / Details: R THA    Cervical / Trunk Assessment Cervical / Trunk Assessment: Normal  Communication   Communication Communication: No apparent difficulties    Cognition Arousal: Alert Behavior During Therapy: WFL for tasks assessed/performed   PT - Cognitive impairments: No apparent impairments     Following commands: Intact       Cueing Cueing Techniques: Verbal cues     General Comments General comments (skin integrity, edema, etc.): Incision intact, covered.        Assessment/Plan    PT Assessment Patient needs continued PT services  PT Problem List Decreased strength;Decreased balance;Pain;Decreased safety awareness;Decreased mobility       PT Treatment Interventions DME instruction;Therapeutic activities;Gait training;Therapeutic exercise;Stair training;Balance training;Functional mobility training;Patient/family education    PT Goals (Current goals can be found in the Care Plan section)  Acute Rehab PT Goals Patient Stated Goal: To improve mobility and decrease pain PT Goal Formulation: With patient Time For Goal Achievement: 11/05/23 Potential to Achieve Goals: Good    Frequency 7X/week        AM-PAC PT "6 Clicks" Mobility  Outcome Measure Help needed turning from your back to your side while in a flat bed without using bedrails?: None Help needed moving from lying on your back to sitting on the side of a flat bed without using bedrails?: None Help needed moving to and from a bed to a chair (including a wheelchair)?: A Little Help needed standing up from a chair using your arms (e.g., wheelchair or bedside chair)?: A Little Help needed to walk in hospital room?: A Little Help needed climbing 3-5 steps with a railing? : A Little 6 Click Score: 20    End of Session Equipment Utilized During Treatment: Gait belt Activity Tolerance: Patient tolerated treatment  well Patient left: in chair;with call bell/phone within reach Nurse Communication: Mobility status PT Visit Diagnosis: Other abnormalities of gait and mobility (R26.89)    Time: 2956-2130 PT Time Calculation (min) (ACUTE ONLY): 33 min   Charges:   PT Evaluation $PT Eval Low Complexity: 1 Low PT Treatments $Gait Training: 8-22 mins PT General Charges $$ ACUTE PT VISIT: 1 Visit         Harrel Carina, DPT, CLT  Acute Rehabilitation Services Office: [phone] (Secure chat preferred)   Claudia Desanctis 10/22/2023, 4:29 PM

## 2023-10-22 NOTE — Transfer of Care (Signed)
 Immediate Anesthesia Transfer of Care Note  Patient: Billy Dalton.  Procedure(s) Performed: RIGHT TOTAL HIP ARTHROPLASTY ANTERIOR APPROACH (Right: Hip)  Patient Location: PACU  Anesthesia Type:Spinal  Level of Consciousness: drowsy and patient cooperative  Airway & Oxygen Therapy: Patient Spontanous Breathing and Patient connected to face mask oxygen  Post-op Assessment: Report given to RN and Post -op Vital signs reviewed and stable  Post vital signs: Reviewed and stable  Last Vitals:  Vitals Value Taken Time  BP 99/63 0913  Temp    Pulse 52 10/22/23 0913  Resp 16 10/22/23 0913  SpO2 94 % 10/22/23 0913  Vitals shown include unfiled device data.  Last Pain:  Vitals:   10/22/23 0627  TempSrc:   PainSc: 2          Complications: No notable events documented.

## 2023-10-22 NOTE — Discharge Instructions (Signed)
 Per [facility] clinic policy, our goal is ensure optimal postoperative pain control with a multimodal pain management strategy. For all [facility] patients, our goal is to wean post-operative narcotic medications by 6 weeks post-operatively. If this is not possible due to utilization of pain medication prior to surgery, your [facility] doctor will support your acute post-operative pain control for the first 6 weeks postoperatively, with a plan to transition you back to your primary pain team following that. Billy Dalton will work to ensure a Therapist, occupational.  INSTRUCTIONS AFTER JOINT REPLACEMENT   Remove items at home which could result in a fall. This includes throw rugs or furniture in walking pathways ICE to the affected joint every three hours while awake for 30 minutes at a time, for at least the first 3-5 days, and then as needed for pain and swelling.  Continue to use ice for pain and swelling. You may notice swelling that will progress down to the foot and ankle.  This is normal after surgery.  Elevate your leg when you are not up walking on it.   Continue to use the breathing machine you got in the hospital (incentive spirometer) which will help keep your temperature down.  It is common for your temperature to cycle up and down following surgery, especially at night when you are not up moving around and exerting yourself.  The breathing machine keeps your lungs expanded and your temperature down.   DIET:  As you were doing prior to hospitalization, we recommend a well-balanced diet.  DRESSING / WOUND CARE / SHOWERING  Keep the surgical dressing until follow up.  The dressing is water proof, so you can shower without any extra covering.  IF THE DRESSING FALLS OFF or the wound gets wet inside, change the dressing with sterile gauze.  Please use good hand washing techniques before changing the dressing.  Do not use any lotions or creams on the incision until instructed by your surgeon.     ACTIVITY  Increase activity slowly as tolerated, but follow the weight bearing instructions below.   No driving for 6 weeks or until further direction given by your physician.  You cannot drive while taking narcotics.  No lifting or carrying greater than 10 lbs. until further directed by your surgeon. Avoid periods of inactivity such as sitting longer than an hour when not asleep. This helps prevent blood clots.  You may return to work once you are authorized by your doctor.     WEIGHT BEARING   Weight bearing as tolerated with assist device (walker, cane, etc) as directed, use it as long as suggested by your surgeon or therapist, typically at least 4-6 weeks.   EXERCISES  Results after joint replacement surgery are often greatly improved when you follow the exercise, range of motion and muscle strengthening exercises prescribed by your doctor. Safety measures are also important to protect the joint from further injury. Any time any of these exercises cause you to have increased pain or swelling, decrease what you are doing until you are comfortable again and then slowly increase them. If you have problems or questions, call your caregiver or physical therapist for advice.   Rehabilitation is important following a joint replacement. After just a few days of immobilization, the muscles of the leg can become weakened and shrink (atrophy).  These exercises are designed to build up the tone and strength of the thigh and leg muscles and to improve motion. Often times heat used for twenty to thirty minutes before  working out will loosen up your tissues and help with improving the range of motion but do not use heat for the first two weeks following surgery (sometimes heat can increase post-operative swelling).   These exercises can be done on a training (exercise) mat, on the floor, on a table or on a bed. Use whatever works the best and is most comfortable for you.    Use music or television  while you are exercising so that the exercises are a pleasant break in your day. This will make your life better with the exercises acting as a break in your routine that you can look forward to.   Perform all exercises about fifteen times, three times per day or as directed.  You should exercise both the operative leg and the other leg as well.  Exercises include:   Quad Sets - Tighten up the muscle on the front of the thigh (Quad) and hold for 5-10 seconds.   Straight Leg Raises - With your knee straight (if you were given a brace, keep it on), lift the leg to 60 degrees, hold for 3 seconds, and slowly lower the leg.  Perform this exercise against resistance later as your leg gets stronger.  Leg Slides: Lying on your back, slowly slide your foot toward your buttocks, bending your knee up off the floor (only go as far as is comfortable). Then slowly slide your foot back down until your leg is flat on the floor again.  Angel Wings: Lying on your back spread your legs to the side as far apart as you can without causing discomfort.  Hamstring Strength:  Lying on your back, push your heel against the floor with your leg straight by tightening up the muscles of your buttocks.  Repeat, but this time bend your knee to a comfortable angle, and push your heel against the floor.  You may put a pillow under the heel to make it more comfortable if necessary.   A rehabilitation program following joint replacement surgery can speed recovery and prevent re-injury in the future due to weakened muscles. Contact your doctor or a physical therapist for more information on knee rehabilitation.    CONSTIPATION  Constipation is defined medically as fewer than three stools per week and severe constipation as less than one stool per week.  Even if you have a regular bowel pattern at home, your normal regimen is likely to be disrupted due to multiple reasons following surgery.  Combination of anesthesia, postoperative  narcotics, change in appetite and fluid intake all can affect your bowels.   YOU MUST use at least one of the following options; they are listed in order of increasing strength to get the job done.  They are all available over the counter, and you may need to use some, POSSIBLY even all of these options:    Drink plenty of fluids (prune juice may be helpful) and high fiber foods Colace 100 mg by mouth twice a day  Senokot for constipation as directed and as needed Dulcolax (bisacodyl), take with full glass of water  Miralax (polyethylene glycol) once or twice a day as needed.  If you have tried all these things and are unable to have a bowel movement in the first 3-4 days after surgery call either your surgeon or your primary doctor.    If you experience loose stools or diarrhea, hold the medications until you stool forms back up.  If your symptoms do not get better within 1 week  or if they get worse, check with your doctor.  If you experience "the worst abdominal pain ever" or develop nausea or vomiting, please contact the office immediately for further recommendations for treatment.   ITCHING:  If you experience itching with your medications, try taking only a single pain pill, or even half a pain pill at a time.  You can also use Benadryl over the counter for itching or also to help with sleep.   TED HOSE STOCKINGS:  Use stockings on both legs until for at least 2 weeks or as directed by physician office. They may be removed at night for sleeping.  MEDICATIONS:  See your medication summary on the "After Visit Summary" that nursing will review with you.  You may have some home medications which will be placed on hold until you complete the course of blood thinner medication.  It is important for you to complete the blood thinner medication as prescribed.  PRECAUTIONS:  If you experience chest pain or shortness of breath - call 911 immediately for transfer to the hospital emergency department.    If you develop a fever greater that 101 F, purulent drainage from wound, increased redness or drainage from wound, foul odor from the wound/dressing, or calf pain - CONTACT YOUR SURGEON.                                                   FOLLOW-UP APPOINTMENTS:  If you do not already have a post-op appointment, please call the office for an appointment to be seen by your surgeon.  Guidelines for how soon to be seen are listed in your "After Visit Summary", but are typically between 1-4 weeks after surgery.  OTHER INSTRUCTIONS:   Knee Replacement:  Do not place pillow under knee, focus on keeping the knee straight while resting. CPM instructions: 0-90 degrees, 2 hours in the morning, 2 hours in the afternoon, and 2 hours in the evening. Place foam block, curve side up under heel at all times except when in CPM or when walking.  DO NOT modify, tear, cut, or change the foam block in any way.  POST-OPERATIVE OPIOID TAPER INSTRUCTIONS: It is important to wean off of your opioid medication as soon as possible. If you do not need pain medication after your surgery it is ok to stop day one. Opioids include: Codeine, Hydrocodone(Norco, Vicodin), Oxycodone(Percocet, oxycontin) and hydromorphone amongst others.  Long term and even short term use of opiods can cause: Increased pain response Dependence Constipation Depression Respiratory depression And more.  Withdrawal symptoms can include Flu like symptoms Nausea, vomiting And more Techniques to manage these symptoms Hydrate well Eat regular healthy meals Stay active Use relaxation techniques(deep breathing, meditating, yoga) Do Not substitute Alcohol to help with tapering If you have been on opioids for less than two weeks and do not have pain than it is ok to stop all together.  Plan to wean off of opioids This plan should start within one week post op of your joint replacement. Maintain the same interval or time between taking each dose  and first decrease the dose.  Cut the total daily intake of opioids by one tablet each day Next start to increase the time between doses. The last dose that should be eliminated is the evening dose.   MAKE SURE YOU:  Understand these instructions.  Get help right away if you are not doing well or get worse.    Thank you for letting us be a part of your medical care team.  It is a privilege we respect greatly.  We hope these instructions will help you stay on track for a fast and full recovery!      Dental Antibiotics:  In most cases prophylactic antibiotics for Dental procdeures after total joint surgery are not necessary.  Exceptions are as follows:  1. History of prior total joint infection  2. Severely immunocompromised (Organ Transplant, cancer chemotherapy, Rheumatoid biologic meds such as Humera)  3. Poorly controlled diabetes (A1C &gt; 8.0, blood glucose over 200)  If you have one of these conditions, contact your surgeon for an antibiotic prescription, prior to your dental procedure.

## 2023-10-22 NOTE — Plan of Care (Signed)

## 2023-10-22 NOTE — Anesthesia Postprocedure Evaluation (Signed)
 Anesthesia Post Note  Patient: Billy Dalton.  Procedure(s) Performed: RIGHT TOTAL HIP ARTHROPLASTY ANTERIOR APPROACH (Right: Hip)     Patient location during evaluation: PACU Anesthesia Type: MAC Level of consciousness: awake Pain management: pain level controlled Vital Signs Assessment: post-procedure vital signs reviewed and stable Respiratory status: spontaneous breathing, respiratory function stable and nonlabored ventilation Cardiovascular status: blood pressure returned to baseline and stable Postop Assessment: no headache, no backache and no apparent nausea or vomiting Anesthetic complications: no   No notable events documented.  Last Vitals:  Vitals:   10/22/23 1200 10/22/23 1215  BP: 106/66 109/67  Pulse: (!) 47 (!) 48  Resp: 14 16  Temp:    SpO2: 96% 94%    Last Pain:  Vitals:   10/22/23 1145  TempSrc:   PainSc: 0-No pain            L Sensory Level: L3-Anterior knee, lower leg (10/22/23 1215) R Sensory Level: L3-Anterior knee, lower leg (10/22/23 1215)  [name]

## 2023-10-22 NOTE — Op Note (Signed)
 Operative Note  Date of operation: 10/22/2023 Preoperative diagnosis: Right hip primary osteoarthritis Postoperative diagnosis: Same  Procedure: Right direct anterior total hip arthroplasty  Implants: Implant Name Type Inv. Item Serial No. Manufacturer Lot No. LRB No. Used Action  PINN SECTOR W/GRIP ACE CUP 56 - [id number] Hips PINN SECTOR W/GRIP ACE CUP 56  [employer] [id number] Right 1 Implanted  PINNACLE ALTRX PLUS 4 N [id number] - [id number] Hips PINNACLE ALTRX PLUS 4 N [id number]  [employer] [id number] Right 1 Implanted  FEM STEM 12/14 TAPER SZ 4 HIP - [id number] Orthopedic Implant FEM STEM 12/14 TAPER SZ 4 HIP  [employer] [id number] Right 1 Implanted  HEAD CERAMIC DELTA 36 PLUS 1.5 - [id number] Hips HEAD CERAMIC DELTA 36 PLUS 1.5  [employer] [id number] Right 1 Implanted   Surgeon: Vanita Panda. 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  This is really been going on for a while now but has gotten to the point where it is detrimentally affecting his quality of life, his mobility and his actives of daily living.  He does have daily right hip pain.  On exam his hip is incredibly stiff and his x-rays show bone-on-bone arthritis.  We have discussed hip replacement surgery and he does wish to proceed with this surgery.  We did discuss the risks of acute blood loss anemia, nerve vessel injury, fracture, infection, DVT, dislocation, implant failure, leg length differences and wound healing issues.  He understands that our goals are hopefully decreased pain, improved mobility and improved quality of life.  Procedure description: After informed consent was obtained and the appropriate right hip was marked, the patient was brought  to the operating room and set up on the stretcher where spinal anesthesia was obtained.  He was then laid in supine position on the stretcher and a Foley catheter was placed.  Traction boots were then placed on both his feet and he was placed supine on the Hana fracture table with a perineal post and placed in both legs in inline skeletal traction devices but no traction applied.  His right operative hip and pelvis were assessed radiographically.  The right hip was prepped and draped with DuraPrep and sterile drapes.  A timeout was called and he was identified as the correct patient and the correct right hip.  An incision was then made just inferior and posterior to the ASIS and carried slightly obliquely down the leg.  Dissection was carried down to the tensor fascia lata muscle and the tensor fascia was then divided longitudinally to proceed with a direct anterior approach to the hip.  Circumflex vessels were identified and cauterized.  The hip capsule was identified and opened up in a L-type format finding a moderate joint effusion.  There was significant periarticular osteophytes around the lateral femoral head neck.  Of note the hip was incredibly stiff with rotation as well.  Cobra retractors were placed around the medial and lateral femoral neck and a femoral neck cut was made with an oscillating saw just proximal to the lesser trochanter.  This cut was completed with an osteotome.  A corkscrew guide was placed in the femoral head and the femoral head was removed its entirety and there was a wide area of polished and hard as a rock and  completely devoid of cartilage.  A bent Hohmann was then placed over the medial lesser rim and remnants of the acetabular labrum and other debris removed.  Reaming was then initiated under direct visualization from a size 43 reamer and stepwise increments going up to a size 55 reamer with all reamers placed under direct visualization and the last reamer also placed under direct  fluoroscopy in order to obtain the depth reaming, the inclination and the anteversion.  The real DePuy Sectra GRIPTION acetabular component size 56 was placed without difficulty followed by a 36+4 polyethylene liner.  Attention was then turned to the femur.  With the right leg externally rotated to 120 degrees, extended and adducted, a Mueller retractor was placed medially and a Hohmann retractor behind the greater trochanter.  The lateral joint capsule was released and a box cutting osteotome was used in her femoral canal.  Broaching was then initiated using the Actis broaching system from a size 0 going up to a size 4.  With a size 4 in place we trialed a offset femoral neck and a 36+1.5 trial hip ball.  The right leg was brought over and up and with traction and internal rotation reduced in the pelvis.  It was incredibly tight but we felt like we maximized his offset and leg length and it was stable on exam and radiographically stable.  We dislocated the hip remove the trial components.  We placed the real Actis femoral component with high offset size 4 and the real 36+1.5 ceramic hip ball.  Again this reduced the acetabulum and we are pleased with his range of motion, stability, offset and leg length.  Again these were measured radiographically and clinically.  The soft tissue was then irrigated with normal saline solution.  The joint capsule was closed with interrupted #1 Ethibond suture followed by normal Vicryl to close the tensor fascia.  0 Vicryl is used to close deep tissue and 2-0 Vicryl was used to close subcutaneous tissue.  The skin was closed with staples.  An Aquacel dressing was applied.  The patient was taken off of the Hana table and taken the recovery room in stable condition.  Rexene Edison, PA-C did assist during the entire case and beginning to end and his assistance was crucial and medically necessary for soft tissue management and retraction, helping guide implant placement and a layered closure  of the wound.

## 2023-10-23 ENCOUNTER — Other Ambulatory Visit: Payer: Self-pay

## 2023-10-23 DIAGNOSIS — M1611 Unilateral primary osteoarthritis, right hip: Secondary | ICD-10-CM | POA: Diagnosis not present

## 2023-10-23 DIAGNOSIS — Z96641 Presence of right artificial hip joint: Secondary | ICD-10-CM

## 2023-10-23 LAB — CBC
HCT: 40.8 % (ref 39.0–52.0)
Hemoglobin: 13.1 g/dL (ref 13.0–17.0)
MCH: 26.5 pg (ref 26.0–34.0)
MCHC: 32.1 g/dL (ref 30.0–36.0)
MCV: 82.4 fL (ref 80.0–100.0)
Platelets: 350 10*3/uL (ref 150–400)
RBC: 4.95 MIL/uL (ref 4.22–5.81)
RDW: 14.2 % (ref 11.5–15.5)
WBC: 10.6 10*3/uL — ABNORMAL HIGH (ref 4.0–10.5)
nRBC: 0 % (ref 0.0–0.2)

## 2023-10-23 LAB — BASIC METABOLIC PANEL
Anion gap: 6 (ref 5–15)
BUN: 14 mg/dL (ref 8–23)
CO2: 24 mmol/L (ref 22–32)
Calcium: 8 mg/dL — ABNORMAL LOW (ref 8.9–10.3)
Chloride: 106 mmol/L (ref 98–111)
Creatinine, Ser: 1.02 mg/dL (ref 0.61–1.24)
GFR, Estimated: >60 mL/min (ref >60)
Glucose, Bld: 135 mg/dL — ABNORMAL HIGH (ref 70–99)
Potassium: 3.6 mmol/L (ref 3.5–5.1)
Sodium: 136 mmol/L (ref 135–145)

## 2023-10-23 NOTE — Discharge Planning (Signed)
 Patient alert. IV access removed. Discharge teaching given to patient Billy Dalton.  Patient verbalized understanding of teaching including medications. Discharge summary placed in discharge packet and put with patient belongings. Patient will be transported home with his wife Billy Dalton.

## 2023-10-23 NOTE — Progress Notes (Signed)
 Subjective: 1 Day Post-Op Procedure(s) (LRB): RIGHT TOTAL HIP ARTHROPLASTY ANTERIOR APPROACH (Right) Patient reports pain as mild.    Objective: Vital signs in last 24 hours: Temp:  [96.9 F (36.1 C)-98 F (36.7 C)] 98 F (36.7 C) (03/07 0416) Pulse Rate:  [45-72] 72 (03/07 0416) Resp:  [10-19] 19 (03/07 0416) BP: (95-151)/(58-80) 151/80 (03/07 0416) SpO2:  [93 %-98 %] 96 % (03/07 0416) Weight:  [99.8 kg] 99.8 kg (03/06 1250)  Intake/Output from previous day: 03/06 0701 - 03/07 0700 In: 1550.6 [P.O.:600; I.V.:690.4; IV Piggyback:260.2] Out: 1000 [Urine:850; Blood:150] Intake/Output this shift: Total I/O In: -  Out: 650 [Urine:650]  Recent Labs    10/23/23 0607  HGB 13.1   Recent Labs    10/23/23 0607  WBC 10.6*  RBC 4.95  HCT 40.8  PLT 350   No results for input(s): "NA", "K", "CL", "CO2", "BUN", "CREATININE", "GLUCOSE", "CALCIUM" in the last 72 hours. No results for input(s): "LABPT", "INR" in the last 72 hours.  Sensation intact distally Intact pulses distally Dorsiflexion/Plantar flexion intact Incision: dressing C/D/I   Assessment/Plan: 1 Day Post-Op Procedure(s) (LRB): RIGHT TOTAL HIP ARTHROPLASTY ANTERIOR APPROACH (Right) Up with therapy Discharge home with home health      Kathryne Hitch 10/23/2023, 6:39 AM

## 2023-10-23 NOTE — Progress Notes (Signed)
 Physical Therapy Treatment Patient Details Name: Billy Dalton. MRN: [id number] DOB: 08-Apr-1956 Today's Date: 10/23/2023   History of Present Illness Pt is 68 yo presenting to [facility] on 3/6 for planned THA on the R. PMH: DM, HTN, OA of R hip    PT Comments  Pt seen for PT tx with pt agreeable, wife present for session. Pt reporting more pain in R hip compared to yesterday but still 3/10. Pt's wife provides assistance for bed mobility but pt able to ambulate in room & hallway with RW & supervision with improving gait pattern as distance progressed. Pt negotiated flight of stairs with min assist with instruction re: gait pattern. Educated pt & wife on car transfer technique. All questions/concerns addressed. Will continue to follow pt acutely to address strengthening, balance, & gait with LRAD.    If plan is discharge home, recommend the following: Assist for transportation;Assistance with cooking/housework;Help with stairs or ramp for entrance   Can travel by private vehicle        Equipment Recommendations  Rolling walker (2 wheels)    Recommendations for Other Services       Precautions / Restrictions Precautions Precautions: Fall Recall of Precautions/Restrictions: Intact Restrictions Weight Bearing Restrictions Per Provider Order: Yes RLE Weight Bearing Per Provider Order: Weight bearing as tolerated     Mobility  Bed Mobility Overal bed mobility: Needs Assistance Bed Mobility: Supine to Sit     Supine to sit: Min assist, HOB elevated, Used rails     General bed mobility comments: wife assists with moving RLE to EOB, uprighting trunk, does well at cuing pt to use bed rails, HOB elevated, pt limited by pain in R hip    Transfers Overall transfer level: Needs assistance Equipment used: Rolling walker (2 wheels) Transfers: Sit to/from Stand Sit to Stand: Supervision           General transfer comment: STS from EOB    Ambulation/Gait Ambulation/Gait assistance:  Supervision Gait Distance (Feet): 150 Feet Assistive device: Rolling walker (2 wheels) Gait Pattern/deviations: Decreased step length - right, Decreased stride length, Decreased dorsiflexion - right, Decreased step length - left Gait velocity: decreased     General Gait Details: Initially, pt with decreased dorsiflexion & heel strike but this improves greatly as gait distance increased. PT provides cuing for shoulder depression/relaxation as pt with B shoulder elevation.   Stairs Stairs: Yes Stairs assistance: Min assist Stair Management: One rail Left, Step to pattern Number of Stairs: 11 (6") General stair comments: Pt negotiates flight of stairs with L rail with min assist with PT providing education re: compensatory technique. Educated pt & wife on caregiver positioning in relation to pt when negotiating stairs.   Wheelchair Mobility     Tilt Bed    Modified Rankin (Stroke Patients Only)       Balance Overall balance assessment: Needs assistance Sitting-balance support: Feet supported Sitting balance-Leahy Scale: Good     Standing balance support: During functional activity, Bilateral upper extremity supported, Reliant on assistive device for balance Standing balance-Leahy Scale: Fair                              Communication    Cognition Arousal: Alert Behavior During Therapy: WFL for tasks assessed/performed   PT - Cognitive impairments: No apparent impairments  Following commands: Intact      Cueing    Exercises      General Comments General comments (skin integrity, edema, etc.): Educated pt & wife on car transfer with pt verbalizing again at end of session.      Pertinent Vitals/Pain Pain Assessment Pain Assessment: 0-10 Pain Score: 3  Pain Location: R hip Pain Descriptors / Indicators: Discomfort Pain Intervention(s): Monitored during session    Home Living                           Prior Function            PT Goals (current goals can now be found in the care plan section) Acute Rehab PT Goals Patient Stated Goal: To improve mobility and decrease pain PT Goal Formulation: With patient Time For Goal Achievement: 11/05/23 Potential to Achieve Goals: Good Progress towards PT goals: Progressing toward goals    Frequency    7X/week      PT Plan      Co-evaluation              AM-PAC PT "6 Clicks" Mobility   Outcome Measure  Help needed turning from your back to your side while in a flat bed without using bedrails?: None Help needed moving from lying on your back to sitting on the side of a flat bed without using bedrails?: A Little Help needed moving to and from a bed to a chair (including a wheelchair)?: A Little Help needed standing up from a chair using your arms (e.g., wheelchair or bedside chair)?: A Little Help needed to walk in hospital room?: A Little Help needed climbing 3-5 steps with a railing? : A Little 6 Click Score: 19    End of Session Equipment Utilized During Treatment: Gait belt Activity Tolerance: Patient tolerated treatment well Patient left: in bed;with call bell/phone within reach;with family/visitor present Nurse Communication: Mobility status PT Visit Diagnosis: Other abnormalities of gait and mobility (R26.89);Pain;Muscle weakness (generalized) (M62.81) Pain - Right/Left: Right Pain - part of body: Hip     Time: 1610-9604 PT Time Calculation (min) (ACUTE ONLY): 19 min  Charges:    $Gait Training: 8-22 mins PT General Charges $$ ACUTE PT VISIT: 1 Visit                     Aleda Grana, PT, DPT 10/23/23, 10:21 AM   Sandi Mariscal 10/23/2023, 10:20 AM

## 2023-10-23 NOTE — TOC Initial Note (Signed)
 Transition of Care (TOC) - Initial/Assessment Note    Patient Details  Name: Billy Dalton. MRN: [id number] Date of Birth: November 29, 1955  Transition of Care [facility]) CM/SW Contact:    Epifanio Lesches, RN Phone Number: 10/23/2023, 7:39 AM  Clinical Narrative:        -    s/p RIGHT TOTAL HIP ARTHROPLASTY, 6/6        Pt from home. PTA independent with ADL's. Owns RW. Referral made with DME: BSC with [employer] Equipment will be delivered to bedside prior to d/c.  Home health PT services will be provided by [facility] ( prearranged by provider's office). Pt without RX med concerns and transportation issues. Therapy to clear for discharge.  TOC team following for needs....  Expected Discharge Plan: Home w Home Health Services Barriers to Discharge: Continued Medical Work up   Patient Goals and CMS Choice     Choice offered to / list presented to : Patient      Expected Discharge Plan and Services   Discharge Planning Services: CM Consult     Expected Discharge Date: 10/23/23               DME Arranged: Bedside commode DME Agency: [employer] Date DME Agency Contacted: 10/23/23 Time DME Agency Contacted: [phone] Representative spoke with at DME Agency: Vaughan Basta HH Arranged: PT HH Agency: [facility] Date [facility] Agency Contacted: 10/23/23 Time HH Agency Contacted: [phone] Representative spoke with at [facility] Agency: Haywood Lasso  Prior Living Arrangements/Services                       Activities of Daily Living   ADL Screening (condition at time of admission) Independently performs ADLs?: Yes (appropriate for developmental age) Is the patient deaf or have difficulty hearing?: No Does the patient have difficulty seeing, even when wearing glasses/contacts?: No Does the patient have difficulty concentrating, remembering, or making decisions?: No  Permission Sought/Granted                  Emotional Assessment               Admission diagnosis:  Status post total replacement of right hip [Z96.641] Patient Active Problem List   Diagnosis Date Noted   Status post total replacement of right hip 10/22/2023   Unilateral primary osteoarthritis, right hip 10/01/2023   OSA on CPAP 03/10/2017   Hyperlipidemia 07/20/2013   PCP:  Caffie Damme, MD Pharmacy:   [facility] - [place], [place] - [address] [facility] AT Portal to Reg[facility] [address] [place] [address] Phone: [phone] Fax: [phone]  [phone] [place] [place] - [address] [place] [address] Phone: [phone] Fax: [phone]     Social Drivers of Health (SDOH) Social History: SDOH Screenings   Food Insecurity: No Food Insecurity (10/22/2023)  Housing: Low Risk  (10/22/2023)  Transportation Needs: No Transportation Needs (10/22/2023)  Utilities: Not At Risk (10/22/2023)  Financial Resource Strain: Low Risk  (05/02/2022)   [facility] from [facility], [facility]  Physical Activity: Unknown (04/30/2022)   Received from [facility], [facility]  Social Connections: Socially Integrated (10/22/2023)  Stress: No Stress Concern Present (04/04/2019)   Received from [place], [facility], [place], [facility], and [facility]  Tobacco Use: Low Risk  (10/22/2023)   SDOH Interventions:     Readmission Risk Interventions  No data to display

## 2023-10-23 NOTE — Discharge Summary (Signed)
 Patient ID: Billy Dalton. MRN: [id number] DOB/AGE: 1956/05/15 68 y.o.  Admit date: 10/22/2023 Discharge date: 10/23/2023  Admission Diagnoses:  Principal Problem:   Unilateral primary osteoarthritis, right hip Active Problems:   Status post total replacement of right hip   Discharge Diagnoses:  Same  Past Medical History:  Diagnosis Date   Diabetes mellitus without complication (HCC)    Hypertension    Osteoarthritis of right hip     Surgeries: Procedure(s): RIGHT TOTAL HIP ARTHROPLASTY ANTERIOR APPROACH on 10/22/2023   Consultants:   Discharged Condition: Improved  Hospital Course: Billy Dalton. is an 68 y.o. male who was admitted 10/22/2023 for operative treatment ofUnilateral primary osteoarthritis, right hip. Patient has severe unremitting pain that affects sleep, daily activities, and work/hobbies. After pre-op clearance the patient was taken to the operating room on 10/22/2023 and underwent  Procedure(s): RIGHT TOTAL HIP ARTHROPLASTY ANTERIOR APPROACH.    Patient was given perioperative antibiotics:  Anti-infectives (From admission, onward)    Start     Dose/Rate Route Frequency Ordered Stop   10/22/23 1800  vancomycin (VANCOCIN) IVPB 1000 mg/200 mL premix        1,000 mg 200 mL/hr over 60 Minutes Intravenous Every 12 hours 10/22/23 1450 10/22/23 1841   10/22/23 0700  vancomycin (VANCOCIN) IVPB 1000 mg/200 mL premix        1,000 mg 200 mL/hr over 60 Minutes Intravenous 60 min pre-op 10/22/23 0602 10/22/23 1340   10/22/23 0615  ceFAZolin (ANCEF) IVPB 2g/100 mL premix        2 g 200 mL/hr over 30 Minutes Intravenous On call to O.R. 10/22/23 0602 10/22/23 0824   10/22/23 0606  ceFAZolin (ANCEF) 2-4 GM/100ML-% IVPB       Note to Pharmacy: Camillo Flaming: cabinet override      10/22/23 0606 10/22/23 0754        Patient was given sequential compression devices, early ambulation, and chemoprophylaxis to prevent DVT.  Patient benefited maximally from hospital stay and  there were no complications.    Recent vital signs: Patient Vitals for the past 24 hrs:  BP Temp Temp src Pulse Resp SpO2 Height Weight  10/23/23 0416 (!) 151/80 98 F (36.7 C) Oral 72 19 96 % -- --  10/22/23 1953 (!) 143/75 97.9 F (36.6 C) Oral (!) 52 18 95 % -- --  10/22/23 1532 126/69 97.7 F (36.5 C) Oral 62 17 96 % -- --  10/22/23 1253 114/67 97.7 F (36.5 C) -- (!) 48 18 93 % -- --  10/22/23 1250 -- -- -- -- -- -- 5' 8.5" (1.74 m) 99.8 kg  10/22/23 1230 108/64 97.6 F (36.4 C) -- (!) 47 13 95 % -- --  10/22/23 1215 109/67 -- -- (!) 48 16 94 % -- --     Recent laboratory studies:  Recent Labs    10/23/23 0607  WBC 10.6*  HGB 13.1  HCT 40.8  PLT 350  NA 136  K 3.6  CL 106  CO2 24  BUN 14  CREATININE 1.02  GLUCOSE 135*  CALCIUM 8.0*     Discharge Medications:   Allergies as of 10/23/2023   No Known Allergies      Medication List     STOP taking these medications    aspirin EC 81 MG tablet Replaced by: aspirin 81 MG chewable tablet       TAKE these medications    aspirin 81 MG chewable tablet Chew 1 tablet (81 mg total) by mouth  2 (two) times daily. Replaces: aspirin EC 81 MG tablet   atorvastatin 80 MG tablet Commonly known as: LIPITOR Take 80 mg by mouth every evening.   chlorhexidine 4 % external liquid Commonly known as: HIBICLENS Apply 15 mLs (1 Application total) topically as directed for 30 doses. Use as directed daily for 5 days every other week for 6 weeks.   metFORMIN 500 MG 24 hr tablet Commonly known as: GLUCOPHAGE-XR Take 500 mg by mouth every evening.   methocarbamol 500 MG tablet Commonly known as: ROBAXIN Take 1 tablet (500 mg total) by mouth every 6 (six) hours as needed for muscle spasms.   metoprolol succinate 25 MG 24 hr tablet Commonly known as: TOPROL-XL Take 25 mg by mouth in the morning.   mupirocin ointment 2 % Commonly known as: BACTROBAN Place 1 Application into the nose 2 (two) times daily for 60 doses. Use  as directed 2 times daily for 5 days every other week for 6 weeks.   oxyCODONE 5 MG immediate release tablet Commonly known as: Oxy IR/ROXICODONE Take 1-2 tablets (5-10 mg total) by mouth every 6 (six) hours as needed for moderate pain (pain score 4-6) (pain score 4-6).   tadalafil 10 MG tablet Commonly known as: CIALIS Take 10 mg by mouth daily as needed for erectile dysfunction.   tamsulosin 0.4 MG Caps capsule Commonly known as: FLOMAX Take 0.4 mg by mouth at bedtime.   tetrahydrozoline 0.05 % ophthalmic solution Place 1-2 drops into both eyes 3 (three) times daily as needed (dry/irritated eyes.).   triamterene-hydrochlorothiazide 37.5-25 MG tablet Commonly known as: MAXZIDE-25 Take 1 tablet by mouth in the morning.   verapamil 240 MG CR tablet Commonly known as: CALAN-SR Take 240 mg by mouth at bedtime.   VITAMIN C PO Take 1 tablet by mouth every Monday, Tuesday, Wednesday, Thursday, and Friday.               Durable Medical Equipment  (From admission, onward)           Start     Ordered   10/22/23 1451  DME 3 n 1  Once        10/22/23 1450   10/22/23 1451  DME Walker rolling  Once       Question Answer Comment  Walker: With 5 Inch Wheels   Patient needs a walker to treat with the following condition Status post total replacement of right hip      10/22/23 1450            Diagnostic Studies: DG Pelvis Portable Result Date: 10/22/2023 CLINICAL DATA:  Postop. EXAM: PORTABLE PELVIS 1-2 VIEWS COMPARISON:  None Available. FINDINGS: Right hip arthroplasty in expected alignment. No periprosthetic lucency or fracture. Recent postsurgical change includes air and edema in the soft tissues. Lateral skin staples in place. IMPRESSION: Right hip arthroplasty without immediate postoperative complication. Electronically Signed   By: Narda Rutherford M.D.   On: 10/22/2023 09:51   DG HIP UNILAT WITH PELVIS 1V RIGHT Result Date: 10/22/2023 CLINICAL DATA:  Elective  surgery. EXAM: DG HIP (WITH OR WITHOUT PELVIS) 1V RIGHT COMPARISON:  None Available. FINDINGS: Eleven fluoroscopic spot views of the pelvis and right hip obtained in the operating room. Sequential images during hip arthroplasty. Fluoroscopy time 23 seconds. Dose 3.31 mGy. IMPRESSION: Intraoperative fluoroscopy during right hip arthroplasty. Electronically Signed   By: Narda Rutherford M.D.   On: 10/22/2023 09:47   DG C-Arm 1-60 Min-No Report Result Date: 10/22/2023 Fluoroscopy was  utilized by the requesting physician.  No radiographic interpretation.   XR HIP UNILAT W OR W/O PELVIS 2-3 VIEWS RIGHT Result Date: 10/01/2023 An AP pelvis and lateral of the right hip shows severe end-stage arthritis of the right hip.  The left hip joint space shows only some mild narrowing.  The right hip has bone-on-bone wear of the superior lateral aspect with some slight flattening of the femoral head, sclerotic changes and osteophytes around the femoral head.   Disposition: Discharge disposition: 01-Home or Self Care          Follow-up Information     Kathryne Hitch, MD Follow up in 2 week(s).   Specialty: Orthopedic Surgery Contact information: [address] [place] [address] [phone]         Caffie Damme, MD Follow up.   Specialty: Family Medicine Contact information: [address] [place] [address] [phone]         [facility] Follow up.   Specialty: Home Health Services Why: home health services will be provided by [facility] ,start of care within 48 hours post discharge Contact information: [address] [place] [address] [phone]                  Signed: Kathryne Hitch 10/23/2023, 12:13 PM

## 2023-10-26 ENCOUNTER — Encounter (HOSPITAL_COMMUNITY): Payer: Self-pay | Admitting: Orthopaedic Surgery

## 2023-10-27 ENCOUNTER — Ambulatory Visit (INDEPENDENT_AMBULATORY_CARE_PROVIDER_SITE_OTHER): Admitting: Physician Assistant

## 2023-10-27 ENCOUNTER — Encounter: Payer: Self-pay | Admitting: Physician Assistant

## 2023-10-27 DIAGNOSIS — Z96641 Presence of right artificial hip joint: Secondary | ICD-10-CM

## 2023-10-27 NOTE — Progress Notes (Signed)
 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  He comes in today due to some drainage on his incision.  He states he has been having fevers of 101 to 100 F in the evenings.  He had his first bowel movement last night.  Today's temperature is 98.1 F.  He has had no chills no shortness of breath chest pain.  He is on aspirin for DVT prophylaxis.  He is no longer taking any pain medications.  Review of systems see HPI otherwise negative  Physical exam: General Well-developed well-nourished male no acute distress mood and affect appropriate.  Right hip: Surgical incisions well-approximated staples no signs of infection.  No drainage.  Right calf supple nontender.  Dorsiflexion plantarflexion right ankle intact.  Impression: Status post right total hip arthroplasty  Plan: He will place the other Aquacel dressing on the incision whenever he arrives home.  He can take shower prior to this and wash the area and then completely dry it.  He will continue his 81 mg aspirin twice daily for DVT prophylaxis.  Follow-up with [place] at his regular scheduled appointment in 1 week sooner if there is any questions concerns.

## 2023-11-05 ENCOUNTER — Encounter: Payer: Self-pay | Admitting: Orthopaedic Surgery

## 2023-11-05 ENCOUNTER — Ambulatory Visit: Payer: PRIVATE HEALTH INSURANCE | Admitting: Orthopaedic Surgery

## 2023-11-05 DIAGNOSIS — Z96641 Presence of right artificial hip joint: Secondary | ICD-10-CM

## 2023-11-05 MED ORDER — OXYCODONE HCL 5 MG PO TABS
5.0000 mg | ORAL_TABLET | Freq: Four times a day (QID) | ORAL | 0 refills | Status: DC | PRN
Start: 2023-11-05 — End: 2023-12-14

## 2023-11-05 NOTE — Progress Notes (Signed)
 The patient is here today for his first postoperative visit status post a right total hip arthroplasty.  He is a cardiologist.  He will need to be out of work for several months as he recovers from surgery.  He is walking without an assistive device today.  This is been the first day he has been able to do that.  He has been compliant with a baby aspirin twice a day.  He is taking oxycodone on occasion and does need a refill of this.  His right hip looks good.  The staples are removed and Steri-Strips applied.  He does have a moderate seroma and I did ask fluid from the hip.  His leg lengths with laying supine are near equal.  He does walk with a slight limp.  This hopefully will improve the further he gets out from surgery.  From my standpoint we will see him back in 4 weeks to see how he is doing overall but no x-rays are needed.  All questions concerns were answered and addressed.  I anticipate keeping out of work till probably early May.

## 2023-11-08 NOTE — Therapy (Unsigned)
 OUTPATIENT PHYSICAL THERAPY LOWER EXTREMITY EVALUATION   Patient Name: Billy Dalton. MRN: [id number] DOB:1956-07-13, 68 y.o., male Today's Date: 11/09/2023  END OF SESSION:  PT End of Session - 11/09/23 1049     Visit Number 1    Number of Visits 16    Date for PT Re-Evaluation 01/04/24    Authorization Type not available    PT Start Time 0932    PT Stop Time 1030    PT Time Calculation (min) 58 min    Activity Tolerance Patient tolerated treatment well             Past Medical History:  Diagnosis Date   Diabetes mellitus without complication (HCC)    Hypertension    Osteoarthritis of right hip    Past Surgical History:  Procedure Laterality Date   LAMINECTOMY  08/18/2014   PROSTATE BIOPSY     yearly   TOTAL HIP ARTHROPLASTY Right 10/22/2023   Procedure: RIGHT TOTAL HIP ARTHROPLASTY ANTERIOR APPROACH;  Surgeon: Kathryne Hitch, MD;  Location: [facility] OR;  Service: Orthopedics;  Laterality: Right;   WISDOM TOOTH EXTRACTION     Patient Active Problem List   Diagnosis Date Noted   Status post total replacement of right hip 10/22/2023   OSA on CPAP 03/10/2017   Hyperlipidemia 07/20/2013    PCP: Dr Caffie Damme  REFERRING PROVIDER: Dr Doneen Poisson  REFERRING DIAG: s/p R THA  THERAPY DIAG:  Status post right hip replacement  Abnormal posture  Other symptoms and signs involving the musculoskeletal system  Muscle weakness (generalized)  Other abnormalities of gait and mobility  Rationale for Evaluation and Treatment: Rehabilitation  ONSET DATE: 10/22/23  SUBJECTIVE:   SUBJECTIVE STATEMENT: History of R hip pain over the past 2 years gradually worsening. Has a seroma anterior R hip but has not had post op complications.   PERTINENT HISTORY: HTN; AODM; chest pain with stress test - no blockage; lumbar discectomy L4/5 ~ 12 yrs ago; occasional R LB discomfort   PAIN:  Are you having pain? Yes: NPRS scale: 0/10 Pain location: R hip  Pain  description: dull  Aggravating factors: end of the day  Relieving factors: meds; heat LB   PRECAUTIONS: Anterior hip  RED FLAGS: None   WEIGHT BEARING RESTRICTIONS: No  FALLS:  Has patient fallen in last 6 months? 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  DIAGNOSTIC FINDINGS: xray 10/22/23 - Right hip arthroplasty without immediate postoperative complication.   PATIENT SURVEYS:  LEFS 14/80; 17.5%   COGNITION: Overall cognitive status: Within functional limits for tasks assessed     SENSATION: Superficial seroma with some abnormal sensation    EDEMA:  Superficial seroma posterior aspect of R hip ~ length of incision and ~ 6-8 cm in width; firm to touch   MUSCLE LENGTH: Hamstrings: Right 45 deg; Left 35 deg Hip flexors - tight R > L  POSTURE: rounded shoulders, forward head, decreased lumbar lordosis, increased thoracic  kyphosis, right pelvic obliquity, flexed trunk , and weight shift left; functional leg length difference in standing with R LE longer than L   PALPATION: Tenderness to palpation over the seroma R anterior/lateral hip  LOWER EXTREMITY ROM:  Active ROM Right eval Left eval  Hip flexion 90   Hip extension 0   Hip abduction 35   Hip adduction    Hip internal rotation 10   Hip external rotation 30   Knee flexion 120   Knee extension 0   Ankle dorsiflexion    Ankle plantarflexion    Ankle  inversion    Ankle eversion     (Blank rows = not tested)  LOWER EXTREMITY MMT: not tested resistively; moves R LE well against gravity   MMT Right eval Left eval  Hip flexion    Hip extension    Hip abduction    Hip adduction    Hip internal rotation    Hip external rotation    Knee flexion    Knee extension    Ankle dorsiflexion    Ankle plantarflexion    Ankle inversion    Ankle eversion     (Blank rows = not tested)   FUNCTIONAL TESTS:  5 times sit to stand: 12.21 sec  GAIT: Distance walked: 40 ft Assistive device utilized: None Level of assistance: Complete Independence Comments: limp R LE in wt bearing R with R hemipelvis posteriorly rotated                                                                                                                                 TREATMENT DATE: 11/09/23  Work on posture and alignment in standing correction retraction of R hip  Provided 3/8 inch lift L heel to improve alignment in standing  See HEP    PATIENT EDUCATION:  Education details: POC; HEP  Person educated: Patient Education method: Programmer, multimedia, Demonstration, Actor cues, Verbal cues, and Handouts Education comprehension: verbalized understanding, returned demonstration, verbal cues required, tactile cues required, and needs further education  HOME EXERCISE PROGRAM: Access Code: [email or web address] URL: [email or web address] Date: 11/09/2023 Prepared by: Corlis Leak  Program Notes working on alignment in standing   Exercises - Supine Transversus Abdominis Bracing with Pelvic Floor Contraction  - 2 x daily - 7 x weekly - 1 sets - 10 reps - 10sec  hold - Supine Heel Slide  - 2 x daily - 7 x weekly - 1 sets - 10 reps - 1-2 sec  hold - Supine Quad Set  - 2 x daily - 7 x weekly - 1-2 sets - 10 reps - 3 sec  hold - Standing Hip Extension with Counter Support  - 2 x daily - 7 x weekly - 1-2 sets - 10 reps - 2-3 sec  hold - Standing Hip Abduction with Counter  Support  - 1 x daily - 7 x weekly - 1-2 sets - 10 reps - 2-3 sec  hold  ASSESSMENT:  CLINICAL IMPRESSION: Patient is a 68 y.o. male who was seen today for physical therapy evaluation and treatment for s/p R THA 10/22/23. He has a history of R hip pain progressively worsening over the past two years. Patient underwent elective R THA, anterior approach, 10/22/23 with no complications other than a seroma  R hip posterior to the incision as noted above. Patient has decreased ROM, mobility, strength R LE; abnormal gait pattern; decreased mobility and transfers; decreased functional activity level and inability to perform normal tasks required for work. Patient has a functional leg length difference with R LE in slight flexion compared to L in standing. Patient will benefit from PT to address problems identified.   OBJECTIVE IMPAIRMENTS: Abnormal gait, decreased activity tolerance, decreased balance, decreased mobility, decreased ROM, decreased strength, and pain.   ACTIVITY LIMITATIONS: carrying, lifting, bending, sitting, standing, squatting, stairs, transfers, bed mobility, and locomotion level  PARTICIPATION LIMITATIONS: driving, community activity, occupation, and yard work  PERSONAL FACTORS: Fitness, Past/current experiences, Time since onset of injury/illness/exacerbation, and comorbidities: HTN; ADOM; LBP post lumbar discectomy are also affecting patient's functional outcome.   REHAB POTENTIAL: Excellent  CLINICAL DECISION MAKING: Evolving/moderate complexity  EVALUATION COMPLEXITY: Moderate   GOALS: Goals reviewed with patient? Yes  SHORT TERM GOALS: Target date: 12/07/2023   Independent in initial HEP  Baseline: Goal status: INITIAL  2.  Improve gait pattern with patient to demonstrate normal weight bearing and weight transfer with weight bearing R LE   Baseline:  Goal status: INITIAL  3.  Patient demonstrates improved posture and alignment in standing with equal wt bearing LE's  and no retraction of R hip in standing  Baseline:  Goal status: INITIAL  LONG TERM GOALS: Target date: 01/04/2024   Improve ROM and mobility R LE to WFL's  Baseline:  Goal status: INITIAL  2.  Improve core strength and stability allowing patient to stand, walk and exercise without LBP  Baseline:  Goal status: INITIAL  3.  Increased strength R LE to 4+/5 to 5/5 allowing patient to return to normal functional activities  Baseline:  Goal status: INITIAL  4.  Normal gait pattern with no assistive device, including ascending and descending steps  Baseline:  Goal status: INITIAL  5.  Improve LEFS by 10-20 points  Baseline: 14/80; 17.5%  Goal status: INITIAL  6.  Independent in HEP, including aquatic program as indicated  Baseline:  Goal status: INITIAL   PLAN:  PT FREQUENCY: 2x/week  PT DURATION: 8 weeks  PLANNED INTERVENTIONS: 97110-Therapeutic exercises, 97530- Therapeutic activity, 97112- Neuromuscular re-education, [phone]- Self Care, 29528- Manual therapy, [phone]- Gait training, [phone]- Aquatic Therapy, [phone]- Electrical stimulation (unattended), [phone]- Ionotophoresis 4mg /ml Dexamethasone, Patient/Family education, Balance training, Stair training, Taping, Dry Needling, Joint mobilization, Cryotherapy, and Moist heat  PLAN FOR NEXT SESSION: review and progress exercise; transfer and transitional activities; gait training; education and home instruction; manual work and modalities as indicated    [employer], PT 11/09/2023, 10:51 AM

## 2023-11-09 ENCOUNTER — Ambulatory Visit
Payer: PRIVATE HEALTH INSURANCE | Attending: Orthopaedic Surgery | Admitting: Rehabilitative and Restorative Service Providers"

## 2023-11-09 ENCOUNTER — Encounter: Payer: Self-pay | Admitting: Rehabilitative and Restorative Service Providers"

## 2023-11-09 ENCOUNTER — Other Ambulatory Visit: Payer: Self-pay

## 2023-11-09 DIAGNOSIS — R293 Abnormal posture: Secondary | ICD-10-CM | POA: Diagnosis present

## 2023-11-09 DIAGNOSIS — M6281 Muscle weakness (generalized): Secondary | ICD-10-CM

## 2023-11-09 DIAGNOSIS — Z96641 Presence of right artificial hip joint: Secondary | ICD-10-CM

## 2023-11-09 DIAGNOSIS — R2689 Other abnormalities of gait and mobility: Secondary | ICD-10-CM

## 2023-11-09 DIAGNOSIS — R29898 Other symptoms and signs involving the musculoskeletal system: Secondary | ICD-10-CM

## 2023-11-12 ENCOUNTER — Encounter: Payer: Self-pay | Admitting: Rehabilitative and Restorative Service Providers"

## 2023-11-12 ENCOUNTER — Ambulatory Visit: Payer: PRIVATE HEALTH INSURANCE | Admitting: Rehabilitative and Restorative Service Providers"

## 2023-11-12 DIAGNOSIS — R293 Abnormal posture: Secondary | ICD-10-CM

## 2023-11-12 DIAGNOSIS — Z96641 Presence of right artificial hip joint: Secondary | ICD-10-CM

## 2023-11-12 DIAGNOSIS — M6281 Muscle weakness (generalized): Secondary | ICD-10-CM

## 2023-11-12 DIAGNOSIS — R29898 Other symptoms and signs involving the musculoskeletal system: Secondary | ICD-10-CM

## 2023-11-12 DIAGNOSIS — R2689 Other abnormalities of gait and mobility: Secondary | ICD-10-CM

## 2023-11-12 NOTE — Therapy (Signed)
 OUTPATIENT PHYSICAL THERAPY LOWER EXTREMITY TREATMENT   Patient Name: Billy Dalton. MRN: [id number] DOB:1956/05/17, 68 y.o., male Today's Date: 11/12/2023  END OF SESSION:  PT End of Session - 11/12/23 1012     Visit Number 2    Number of Visits 16    Date for PT Re-Evaluation 01/04/24    Authorization Type not available    PT Start Time 1012    PT Stop Time 1104    PT Time Calculation (min) 52 min    Activity Tolerance Patient tolerated treatment well             Past Medical History:  Diagnosis Date   Diabetes mellitus without complication (HCC)    Hypertension    Osteoarthritis of right hip    Past Surgical History:  Procedure Laterality Date   LAMINECTOMY  08/18/2014   PROSTATE BIOPSY     yearly   TOTAL HIP ARTHROPLASTY Right 10/22/2023   Procedure: RIGHT TOTAL HIP ARTHROPLASTY ANTERIOR APPROACH;  Surgeon: Kathryne Hitch, MD;  Location: [facility] OR;  Service: Orthopedics;  Laterality: Right;   WISDOM TOOTH EXTRACTION     Patient Active Problem List   Diagnosis Date Noted   Status post total replacement of right hip 10/22/2023   OSA on CPAP 03/10/2017   Hyperlipidemia 07/20/2013    PCP: Dr Caffie Damme  REFERRING PROVIDER: Dr Doneen Poisson  REFERRING DIAG: s/p R THA  THERAPY DIAG:  Status post right hip replacement  Abnormal posture  Other symptoms and signs involving the musculoskeletal system  Muscle weakness (generalized)  Other abnormalities of gait and mobility  Rationale for Evaluation and Treatment: Rehabilitation  ONSET DATE: 10/22/23  SUBJECTIVE:   SUBJECTIVE STATEMENT: Has not done his exercises - has had some general malaise and has not been motivated to exercise. Some discomfort toward the end of the day but no pain. Lift in shoe is fine. Doesn't notice it.   EVAL: History of R hip pain over the past 2 years gradually worsening. Has a seroma anterior R hip but has not had post op complications.   PERTINENT HISTORY: HTN;  AODM; chest pain with stress test - no blockage; lumbar discectomy L4/5 ~ 12 yrs ago; occasional R LB discomfort   PAIN:  Are you having pain? Yes: NPRS scale: 0/10 Pain location: R hip  Pain description: dull  Aggravating factors: end of the day  Relieving factors: meds; heat LB   PRECAUTIONS: Anterior hip   WEIGHT BEARING RESTRICTIONS: No  FALLS:  Has patient fallen in last 6 months? 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  DIAGNOSTIC FINDINGS: xray 10/22/23 - Right hip arthroplasty without immediate postoperative complication.   PATIENT SURVEYS:  LEFS 14/80; 17.5%     SENSATION: Superficial seroma with some abnormal sensation    EDEMA:  Superficial seroma posterior aspect of R hip ~ length of incision and ~ 6-8 cm in width; firm to touch   MUSCLE LENGTH: Hamstrings: Right 45  deg; Left 35 deg Hip flexors - tight R > L  POSTURE: rounded shoulders, forward head, decreased lumbar lordosis, increased thoracic kyphosis, right pelvic obliquity, flexed trunk , and weight shift left; functional leg length difference in standing with R LE longer than L   PALPATION: Tenderness to palpation over the seroma R anterior/lateral hip  LOWER EXTREMITY ROM:  Active ROM Right eval Left eval  Hip flexion 90   Hip extension 0   Hip abduction 35   Hip adduction    Hip internal rotation 10   Hip external rotation 30   Knee flexion 120    Knee extension 0   Ankle dorsiflexion    Ankle plantarflexion    Ankle inversion    Ankle eversion     (Blank rows = not tested)  LOWER EXTREMITY MMT: not tested resistively; moves R LE well against gravity   MMT Right eval Left eval  Hip flexion    Hip extension    Hip abduction    Hip adduction    Hip internal rotation    Hip external rotation    Knee flexion    Knee extension    Ankle dorsiflexion    Ankle plantarflexion    Ankle inversion    Ankle eversion     (Blank rows = not tested)   FUNCTIONAL TESTS:  5 times sit to stand: 12.21 sec  GAIT: Distance walked: 40 ft Assistive device utilized: None Level of assistance: Complete Independence Comments: limp R LE in wt bearing R with R hemipelvis posteriorly rotated   [facility] Adult PT Treatment:                                                DATE: 11/12/23 Therapeutic Exercise: Supine  Quad set 3 sec x 10  SLR painful - added assist with strap 2-3 sec x 5 still painful  Sidelying  Hip abduction leading with  Prone Knee extension toe on table 3 sec x 10  Hip extension 3 sec x 10  Sitting Sit to stand no UE assist x 10  Standing  Hip extension x 10 UE support as needed R/L  Hip abduction leading with heel UE support as needed R/L  Marching UE support as needed x 10 R/L Manual Therapy: STM over seroma to pt tolerance  Trial of kinesotaping seroma R anteriolateral thigh basket weave to assist  Neuromuscular re-ed: SLS focus on equal wt bearing bilat LE's  Mini squat core engaged equal wt bearing bilat LE's  Posterior hips at counter extension R LE with UE support 10-20 sec hold x 5  Therapeutic Activity: Gait: Gait with verbal and tactile cues for alignment, wt shift, improved mechanics for gait walking forward ~ 420 feet x 1; 40 feet x 4; backward walking 40 ft x 2  TREATMENT DATE:  11/09/23  Work on posture and alignment in standing correction retraction of R hip  Provided 3/8 inch lift L heel to improve alignment in standing  See HEP    PATIENT EDUCATION:  Education details: POC; HEP  Person educated: Patient Education method: Programmer, multimedia, Demonstration, Tactile cues, Verbal cues, and Handouts Education comprehension: verbalized understanding, returned demonstration, verbal cues required, tactile cues required, and needs further education  HOME EXERCISE PROGRAM: Access Code: [email or web address] URL: [email or web address] Date: 11/09/2023 Prepared by: Corlis Leak  Program Notes working on alignment in standing   Exercises - Supine Transversus Abdominis Bracing with Pelvic Floor Contraction  - 2 x daily - 7 x weekly - 1 sets - 10 reps - 10sec  hold - Supine Heel Slide  - 2 x daily - 7 x weekly - 1 sets - 10 reps - 1-2 sec  hold - Supine Quad Set  - 2 x daily - 7 x weekly - 1-2 sets - 10 reps - 3 sec  hold - Standing Hip Extension with Counter Support  - 2 x daily - 7 x weekly - 1-2 sets - 10 reps - 2-3 sec  hold - Standing Hip Abduction with Counter Support  - 1 x daily - 7 x weekly - 1-2 sets - 10 reps - 2-3 sec  hold  ASSESSMENT:  CLINICAL IMPRESSION: Patient returns reporting that he has not been motivated to do his exercises at home. Discussed importance of consistent exercise in recovery and encouraged patient to work on exercises at home. Reviewed HEP and progressed exercises in clinic. No new exercises for home.    EVAL: Patient is a 68 y.o. male who was seen today for physical therapy evaluation and treatment for s/p R THA 10/22/23. He has a history of R hip pain progressively worsening over the past two years. Patient underwent elective R THA, anterior approach, 10/22/23 with no complications other than a seroma  R hip posterior to the incision as noted above. Patient has decreased ROM, mobility, strength R LE; abnormal gait pattern; decreased mobility  and transfers; decreased functional activity level and inability to perform normal tasks required for work. Patient has a functional leg length difference with R LE in slight flexion compared to L in standing. Patient will benefit from PT to address problems identified.   OBJECTIVE IMPAIRMENTS: Abnormal gait, decreased activity tolerance, decreased balance, decreased mobility, decreased ROM, decreased strength, and pain.     GOALS: Goals reviewed with patient? Yes  SHORT TERM GOALS: Target date: 12/07/2023   Independent in initial HEP  Baseline: Goal status: INITIAL  2.  Improve gait pattern with patient to demonstrate normal weight bearing and weight transfer with weight bearing R LE   Baseline:  Goal status: INITIAL  3.  Patient demonstrates improved posture and alignment in standing with equal wt bearing LE's and no retraction of R hip in standing  Baseline:  Goal status: INITIAL  LONG TERM GOALS: Target date: 01/04/2024   Improve ROM and mobility R LE to WFL's  Baseline:  Goal status: INITIAL  2.  Improve core strength and stability allowing patient to stand, walk and exercise without LBP  Baseline:  Goal status: INITIAL  3.  Increased strength R LE to 4+/5 to 5/5 allowing patient to return to normal functional activities  Baseline:  Goal status: INITIAL  4.  Normal gait pattern with no assistive device, including ascending and descending steps  Baseline:  Goal status: INITIAL  5.  Improve LEFS by  10-20 points  Baseline: 14/80; 17.5%  Goal status: INITIAL  6.  Independent in HEP, including aquatic program as indicated  Baseline:  Goal status: INITIAL   PLAN:  PT FREQUENCY: 2x/week  PT DURATION: 8 weeks  PLANNED INTERVENTIONS: 97110-Therapeutic exercises, 97530- Therapeutic activity, 97112- Neuromuscular re-education, [phone]- Self Care, [id number]- Manual therapy, [phone]- Gait training, [phone]- Aquatic Therapy, [phone]- Electrical stimulation (unattended), [phone]-  Ionotophoresis 4mg /ml Dexamethasone, Patient/Family education, Balance training, Stair training, Taping, Dry Needling, Joint mobilization, Cryotherapy, and Moist heat  PLAN FOR NEXT SESSION: review and progress exercise; transfer and transitional activities; gait training; education and home instruction; manual work and modalities as indicated    [employer], PT 11/12/2023, 10:13 AM

## 2023-11-16 ENCOUNTER — Ambulatory Visit: Payer: PRIVATE HEALTH INSURANCE

## 2023-11-19 ENCOUNTER — Ambulatory Visit: Payer: PRIVATE HEALTH INSURANCE | Admitting: Rehabilitative and Restorative Service Providers"

## 2023-11-23 ENCOUNTER — Ambulatory Visit: Payer: PRIVATE HEALTH INSURANCE | Admitting: Rehabilitative and Restorative Service Providers"

## 2023-11-23 ENCOUNTER — Ambulatory Visit
Payer: PRIVATE HEALTH INSURANCE | Attending: Orthopaedic Surgery | Admitting: Rehabilitative and Restorative Service Providers"

## 2023-11-23 ENCOUNTER — Encounter: Payer: Self-pay | Admitting: Rehabilitative and Restorative Service Providers"

## 2023-11-23 DIAGNOSIS — R2689 Other abnormalities of gait and mobility: Secondary | ICD-10-CM | POA: Diagnosis present

## 2023-11-23 DIAGNOSIS — Z96641 Presence of right artificial hip joint: Secondary | ICD-10-CM | POA: Insufficient documentation

## 2023-11-23 DIAGNOSIS — R29898 Other symptoms and signs involving the musculoskeletal system: Secondary | ICD-10-CM | POA: Diagnosis present

## 2023-11-23 DIAGNOSIS — M6281 Muscle weakness (generalized): Secondary | ICD-10-CM | POA: Diagnosis present

## 2023-11-23 DIAGNOSIS — R293 Abnormal posture: Secondary | ICD-10-CM | POA: Diagnosis present

## 2023-11-23 NOTE — Therapy (Signed)
 OUTPATIENT PHYSICAL THERAPY LOWER EXTREMITY TREATMENT   Patient Name: Billy Dalton. MRN: [id number] DOB:[birth year], 67 y.o., male Today's Date: 11/23/2023  END OF SESSION:  PT End of Session - 11/23/23 0930     Visit Number 3    Number of Visits 16    Authorization Type not available    PT Start Time 0929    PT Stop Time 1015    PT Time Calculation (min) 46 min    Activity Tolerance Patient tolerated treatment well             Past Medical History:  Diagnosis Date   Diabetes mellitus without complication (HCC)    Hypertension    Osteoarthritis of right hip    Past Surgical History:  Procedure Laterality Date   LAMINECTOMY  08/18/2014   PROSTATE BIOPSY     yearly   TOTAL HIP ARTHROPLASTY Right 10/22/2023   Procedure: RIGHT TOTAL HIP ARTHROPLASTY ANTERIOR APPROACH;  Surgeon: Kathryne Hitch, MD;  Location: [facility] OR;  Service: Orthopedics;  Laterality: Right;   WISDOM TOOTH EXTRACTION     Patient Active Problem List   Diagnosis Date Noted   Status post total replacement of right hip 10/22/2023   OSA on CPAP 03/10/2017   Hyperlipidemia 07/20/2013    PCP: Dr Caffie Damme  REFERRING PROVIDER: Dr Doneen Poisson  REFERRING DIAG: s/p R THA  THERAPY DIAG:  Status post right hip replacement  Abnormal posture  Other symptoms and signs involving the musculoskeletal system  Muscle weakness (generalized)  Other abnormalities of gait and mobility  Rationale for Evaluation and Treatment: Rehabilitation  ONSET DATE: 10/22/23  SUBJECTIVE:   SUBJECTIVE STATEMENT: Has not done his exercises. He had some LBP for a few days and he focused more on walking than exercises.  Lift in shoe is fine. Thinks he should be able to walk and regain his strength.   EVAL: History of R hip pain over the past 2 years gradually worsening. Has a seroma anterior R hip but has not had post op complications.   PERTINENT HISTORY: HTN; AODM; chest pain with stress test - no  blockage; lumbar discectomy L4/5 ~ 12 yrs ago; occasional R LB discomfort   PAIN:  Are you having pain? Yes: NPRS scale: 0/10 Pain location: R hip  Pain description: dull  Aggravating factors: end of the day  Relieving factors: meds; heat LB   PRECAUTIONS: Anterior hip   WEIGHT BEARING RESTRICTIONS: No  FALLS:  Has patient fallen in last 6 months? 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  DIAGNOSTIC FINDINGS: xray 10/22/23 - Right hip arthroplasty without immediate postoperative complication.   PATIENT SURVEYS:  LEFS 14/80; 17.5%     SENSATION: Superficial seroma with some abnormal sensation    EDEMA:  Superficial seroma posterior aspect of R hip ~ length of incision and ~ 6-8 cm in width; firm to touch   MUSCLE LENGTH: Hamstrings: Right 45 deg; Left 35 deg Hip flexors -  tight R > L  POSTURE: rounded shoulders, forward head, decreased lumbar lordosis, increased thoracic kyphosis, right pelvic obliquity, flexed trunk , and weight shift left; functional leg length difference in standing with R LE longer than L   PALPATION: Tenderness to palpation over the seroma R anterior/lateral hip  LOWER EXTREMITY ROM:  Active ROM Right eval Left eval  Hip flexion 90   Hip extension 0   Hip abduction 35   Hip adduction    Hip internal rotation 10   Hip external rotation 30   Knee flexion 120   Knee extension 0   Ankle dorsiflexion     Ankle plantarflexion    Ankle inversion    Ankle eversion     (Blank rows = not tested)  LOWER EXTREMITY MMT: not tested resistively; moves R LE well against gravity   MMT Right eval Left eval  Hip flexion    Hip extension    Hip abduction    Hip adduction    Hip internal rotation    Hip external rotation    Knee flexion    Knee extension    Ankle dorsiflexion    Ankle plantarflexion    Ankle inversion    Ankle eversion     (Blank rows = not tested)   FUNCTIONAL TESTS:  5 times sit to stand: 12.21 sec  GAIT: Distance walked: 40 ft Assistive device utilized: None Level of assistance: Complete Independence Comments: limp R LE in wt bearing R with R hemipelvis posteriorly rotated    [facility] Adult PT Treatment:                                                DATE: 11/23/23 Therapeutic Exercise: Supine  Quad set 3 sec x 10  SLR painful -  assist with strap trying to eccentrically lower x 10   Short arc quad 2-3 sec x 10  Bridge 2-3 sec x 10  Sidelying  Hip abduction leading with heel x 10 R Clamshell x 10 R Sitting Sit to stand no UE assist x 10 use of mirror for neuromuscular re-education - working on equal wt bearing bilat LE's  Standing  Hip extension x 10 UE support as needed R/L  Hip abduction leading with heel UE support as needed R/L  Marching UE support as needed x 10 R/L Manual Therapy: STM over seroma to pt tolerance  Trial of kinesotaping seroma R anteriolateral thigh basket weave to assist  Neuromuscular re-ed: Sit to stand and stand to sit focus on equal wt bearing bilat LE's  Posterior hips at counter extension R LE with UE support 10-20 sec hold x 5  Therapeutic Activity: Gait: Gait with verbal and tactile cues for alignment, wt shift, improved mechanics for gait walking forward; backward - slow steps x 30 ft x 3 each    [facility] Adult PT Treatment:                                                DATE: 11/12/23 Therapeutic Exercise: Supine  Quad set 3  sec x 10  SLR painful - added assist with strap 2-3 sec x 5 still painful  Sidelying  Hip abduction leading with  Prone Knee extension  toe on table 3 sec x 10  Hip extension 3 sec x 10  Sitting Sit to stand no UE assist x 10  Standing  Hip extension x 10 UE support as needed R/L  Hip abduction leading with heel UE support as needed R/L  Marching UE support as needed x 10 R/L Manual Therapy: STM over seroma to pt tolerance  Trial of kinesotaping seroma R anteriolateral thigh basket weave to assist  Neuromuscular re-ed: SLS focus on equal wt bearing bilat LE's  Mini squat core engaged equal wt bearing bilat LE's  Posterior hips at counter extension R LE with UE support 10-20 sec hold x 5  Therapeutic Activity: Gait: Gait with verbal and tactile cues for alignment, wt shift, improved mechanics for gait walking forward ~ 420 feet x 1; 40 feet x 4; backward walking 40 ft x 2                                                                                                                                 TREATMENT DATE: 11/09/23  Work on posture and alignment in standing correction retraction of R hip  Provided 3/8 inch lift L heel to improve alignment in standing  See HEP    PATIENT EDUCATION:  Education details: POC; HEP  Person educated: Patient Education method: Programmer, multimedia, Demonstration, Actor cues, Verbal cues, and Handouts Education comprehension: verbalized understanding, returned demonstration, verbal cues required, tactile cues required, and needs further education  HOME EXERCISE PROGRAM: Access Code: [email or web address] URL: [email or web address] Date: 11/09/2023 Prepared by: Corlis Leak  Program Notes working on alignment in standing   Exercises - Supine Transversus Abdominis Bracing with Pelvic Floor Contraction  - 2 x daily - 7 x weekly - 1 sets - 10 reps - 10sec  hold - Supine Heel Slide  - 2 x daily - 7 x weekly - 1 sets - 10 reps - 1-2 sec  hold - Supine  Quad Set  - 2 x daily - 7 x weekly - 1-2 sets - 10 reps - 3 sec  hold - Standing Hip Extension with Counter Support  - 2 x daily - 7 x weekly - 1-2 sets - 10 reps - 2-3 sec  hold - Standing Hip Abduction with Counter Support  - 1 x daily - 7 x weekly - 1-2 sets - 10 reps - 2-3 sec  hold  ASSESSMENT:  CLINICAL IMPRESSION: Patient returns reporting that he feels he will be okay just working on his walking. After treatment patient states that he can see he needs to work on exercises specifically to gain strength. Focus in clinic today on specific strengthening and working on symmetry with transitional movements and gait. Working on slow controlled gait forward and back  Discussed importance of consistent exercise in recovery and encouraged patient to work on exercises at home. Reviewed HEP and progressed exercises in clinic. Issued new exercises for home.    EVAL: Patient  is a 68 y.o. male who was seen today for physical therapy evaluation and treatment for s/p R THA 10/22/23. He has a history of R hip pain progressively worsening over the past two years. Patient underwent elective R THA, anterior approach, 10/22/23 with no complications other than a seroma  R hip posterior to the incision as noted above. Patient has decreased ROM, mobility, strength R LE; abnormal gait pattern; decreased mobility and transfers; decreased functional activity level and inability to perform normal tasks required for work. Patient has a functional leg length difference with R LE in slight flexion compared to L in standing. Patient will benefit from PT to address problems identified.   OBJECTIVE IMPAIRMENTS: Abnormal gait, decreased activity tolerance, decreased balance, decreased mobility, decreased ROM, decreased strength, and pain.     GOALS: Goals reviewed with patient? Yes  SHORT TERM GOALS: Target date: 12/07/2023   Independent in initial HEP  Baseline: Goal status: INITIAL  2.  Improve gait pattern with patient  to demonstrate normal weight bearing and weight transfer with weight bearing R LE   Baseline:  Goal status: INITIAL  3.  Patient demonstrates improved posture and alignment in standing with equal wt bearing LE's and no retraction of R hip in standing  Baseline:  Goal status: INITIAL  LONG TERM GOALS: Target date: 01/04/2024   Improve ROM and mobility R LE to WFL's  Baseline:  Goal status: INITIAL  2.  Improve core strength and stability allowing patient to stand, walk and exercise without LBP  Baseline:  Goal status: INITIAL  3.  Increased strength R LE to 4+/5 to 5/5 allowing patient to return to normal functional activities  Baseline:  Goal status: INITIAL  4.  Normal gait pattern with no assistive device, including ascending and descending steps  Baseline:  Goal status: INITIAL  5.  Improve LEFS by 10-20 points  Baseline: 14/80; 17.5%  Goal status: INITIAL  6.  Independent in HEP, including aquatic program as indicated  Baseline:  Goal status: INITIAL   PLAN:  PT FREQUENCY: 2x/week  PT DURATION: 8 weeks  PLANNED INTERVENTIONS: 97110-Therapeutic exercises, 97530- Therapeutic activity, 97112- Neuromuscular re-education, [phone]- Self Care, 21308- Manual therapy, [phone]- Gait training, [phone]- Aquatic Therapy, [phone]- Electrical stimulation (unattended), [phone]- Ionotophoresis 4mg /ml Dexamethasone, Patient/Family education, Balance training, Stair training, Taping, Dry Needling, Joint mobilization, Cryotherapy, and Moist heat  PLAN FOR NEXT SESSION: review and progress exercise; transfer and transitional activities; gait training; education and home instruction; manual work and modalities as indicated    [employer], PT 11/23/2023, 9:30 AM

## 2023-11-26 ENCOUNTER — Ambulatory Visit: Payer: PRIVATE HEALTH INSURANCE | Admitting: Rehabilitative and Restorative Service Providers"

## 2023-11-26 ENCOUNTER — Encounter: Payer: Self-pay | Admitting: Rehabilitative and Restorative Service Providers"

## 2023-11-26 DIAGNOSIS — R2689 Other abnormalities of gait and mobility: Secondary | ICD-10-CM

## 2023-11-26 DIAGNOSIS — R29898 Other symptoms and signs involving the musculoskeletal system: Secondary | ICD-10-CM

## 2023-11-26 DIAGNOSIS — Z96641 Presence of right artificial hip joint: Secondary | ICD-10-CM | POA: Diagnosis not present

## 2023-11-26 DIAGNOSIS — R293 Abnormal posture: Secondary | ICD-10-CM

## 2023-11-26 DIAGNOSIS — M6281 Muscle weakness (generalized): Secondary | ICD-10-CM

## 2023-11-26 NOTE — Therapy (Signed)
 OUTPATIENT PHYSICAL THERAPY LOWER EXTREMITY TREATMENT   Patient Name: Billy Dalton. MRN: [id number] DOB:Jul 13, 1956, 68 y.o., male Today's Date: 11/26/2023  END OF SESSION:  PT End of Session - 11/26/23 0949     Visit Number 4    Number of Visits 16    Date for PT Re-Evaluation 01/04/24    Authorization Type not available    PT Start Time 0945    PT Stop Time 1015    PT Time Calculation (min) 30 min    Activity Tolerance Patient tolerated treatment well             Past Medical History:  Diagnosis Date   Diabetes mellitus without complication (HCC)    Hypertension    Osteoarthritis of right hip    Past Surgical History:  Procedure Laterality Date   LAMINECTOMY  08/18/2014   PROSTATE BIOPSY     yearly   TOTAL HIP ARTHROPLASTY Right 10/22/2023   Procedure: RIGHT TOTAL HIP ARTHROPLASTY ANTERIOR APPROACH;  Surgeon: Kathryne Hitch, MD;  Location: [facility] OR;  Service: Orthopedics;  Laterality: Right;   WISDOM TOOTH EXTRACTION     Patient Active Problem List   Diagnosis Date Noted   Status post total replacement of right hip 10/22/2023   OSA on CPAP 03/10/2017   Hyperlipidemia 07/20/2013    PCP: Dr Caffie Damme  REFERRING PROVIDER: Dr Doneen Poisson  REFERRING DIAG: s/p R THA  THERAPY DIAG:  Status post right hip replacement  Abnormal posture  Other symptoms and signs involving the musculoskeletal system  Muscle weakness (generalized)  Other abnormalities of gait and mobility  Rationale for Evaluation and Treatment: Rehabilitation  ONSET DATE: 10/22/23  SUBJECTIVE:   SUBJECTIVE STATEMENT: Slept over on the R hip last night and the hip is hurting today. Has done his exercises since last visit. Still has some LBP which comes and goes. Lift in shoe is fine. Thinks he should be able to walk and regain his strength.   EVAL: History of R hip pain over the past 2 years gradually worsening. Has a seroma anterior R hip but has not had post op  complications.   PERTINENT HISTORY: HTN; AODM; chest pain with stress test - no blockage; lumbar discectomy L4/5 ~ 12 yrs ago; occasional R LB discomfort   PAIN:  Are you having pain? Yes: NPRS scale: 2/10 Pain location: R hip  Pain description: dull  Aggravating factors: end of the day  Relieving factors: meds; heat LB   PRECAUTIONS: Anterior hip   WEIGHT BEARING RESTRICTIONS: No  FALLS:  Has patient fallen in last 6 months? 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  DIAGNOSTIC FINDINGS: xray 10/22/23 - Right hip arthroplasty without immediate postoperative complication.   PATIENT SURVEYS:  LEFS 14/80; 17.5%     SENSATION: Superficial seroma with some abnormal sensation    EDEMA:  Superficial seroma posterior aspect of R hip ~ length of incision and ~ 6-8 cm in width; firm to  touch   MUSCLE LENGTH: Hamstrings: Right 45 deg; Left 35 deg Hip flexors - tight R > L  POSTURE: rounded shoulders, forward head, decreased lumbar lordosis, increased thoracic kyphosis, right pelvic obliquity, flexed trunk , and weight shift left; functional leg length difference in standing with R LE longer than L   PALPATION: Tenderness to palpation over the seroma R anterior/lateral hip  LOWER EXTREMITY ROM:  Active ROM Right eval Left eval  Hip flexion 90   Hip extension 0   Hip abduction 35   Hip adduction    Hip internal rotation 10   Hip  external rotation 30   Knee flexion 120   Knee extension 0   Ankle dorsiflexion    Ankle plantarflexion    Ankle inversion    Ankle eversion     (Blank rows = not tested)  LOWER EXTREMITY MMT: not tested resistively; moves R LE well against gravity   MMT Right eval Left eval  Hip flexion    Hip extension    Hip abduction    Hip adduction    Hip internal rotation    Hip external rotation    Knee flexion    Knee extension    Ankle dorsiflexion    Ankle plantarflexion    Ankle inversion    Ankle eversion     (Blank rows = not tested)   FUNCTIONAL TESTS:  5 times sit to stand: 12.21 sec  GAIT: Distance walked: 40 ft Assistive device utilized: None Level of assistance: Complete Independence Comments: limp R LE in wt bearing R with R hemipelvis posteriorly rotated    [facility] Adult PT Treatment:                                                DATE: 11/26/23 Therapeutic Exercise: Supine  Supine hip extension with knee flexion for stretch through anterior hip 20-30 sec x 3   Sitting Sit to stand no UE assist x 5 use of mirror for neuromuscular re-education - working on equal wt bearing bilat LE's  Standing  Mini squat x 10  Manual Therapy: Gentle P/AAROM R hip into hip extension; hip rotation; hip ab/adduction  Neuromuscular re-ed: Sit to stand and stand to sit focus on equal wt bearing bilat LE's  Standing working on equal wt bearing and wt shift working to improve wt shift and control wt to R   Therapeutic Activity: Gait: Slow gait with verbal and tactile cues for alignment, wt shift, improved mechanics for gait walking forward; backward - step and stop steps x 30 ft x 3 each   [facility] Adult PT Treatment:                                                DATE: 11/23/23 Therapeutic Exercise: Supine  Quad set 3 sec x 10  SLR painful -  assist with strap trying to eccentrically lower x 10   Short arc quad 2-3 sec x 10  Bridge 2-3 sec x 10  Sidelying  Hip abduction leading  with heel x 10 R Clamshell x 10 R Sitting Sit to stand no UE assist x 10 use of mirror for neuromuscular re-education - working on equal wt bearing bilat LE's  Standing  Hip extension x 10 UE support as needed R/L  Hip abduction leading with heel UE support as needed R/L  Marching UE support as needed x 10 R/L Manual Therapy: STM over seroma to pt tolerance  Trial of kinesotaping seroma R anteriolateral thigh basket weave to assist  Neuromuscular re-ed: Sit to stand and stand to sit focus on equal wt bearing bilat LE's  Posterior hips at counter extension R LE with UE support 10-20 sec hold x 5  Therapeutic Activity: Gait: Gait with verbal and tactile cues for alignment, wt shift, improved mechanics for gait walking forward; backward - slow steps x 30 ft x 3 each    [facility] Adult PT Treatment:                                                DATE: 11/12/23 Therapeutic Exercise: Supine  Quad set 3 sec x 10  SLR painful - added assist with strap 2-3 sec x 5 still painful  Sidelying  Hip abduction leading with  Prone Knee extension toe on table 3 sec x 10  Hip extension 3 sec x 10  Sitting Sit to stand no UE assist x 10  Standing  Hip extension x 10 UE support as needed R/L  Hip abduction leading with heel UE support as needed R/L  Marching UE support as needed x 10 R/L Manual Therapy: STM over seroma to pt tolerance  Trial of kinesotaping seroma R anteriolateral thigh basket weave to assist  Neuromuscular re-ed: SLS focus on equal wt bearing bilat LE's  Mini squat core engaged equal wt bearing bilat LE's  Posterior hips at counter extension R LE with UE support 10-20 sec hold x 5  Therapeutic Activity: Gait: Gait with verbal and tactile cues for alignment, wt shift, improved mechanics for gait walking forward ~ 420 feet x 1; 40 feet x 4; backward walking 40 ft x 2                                                                                                                                  TREATMENT DATE: 11/09/23  Work on posture and alignment in standing correction retraction of R hip  Provided 3/8 inch lift L heel to improve alignment in standing  See HEP    PATIENT EDUCATION:  Education details: POC; HEP  Person educated: Patient Education method: Programmer, multimedia, Demonstration, Actor cues, Verbal cues, and Handouts Education comprehension: verbalized understanding, returned demonstration, verbal cues required, tactile cues required, and needs further education  HOME EXERCISE PROGRAM: Access Code: [email or web address] URL: [email or web address] Date: 11/09/2023 Prepared by: Corlis Leak  Program Notes working on alignment in standing   Exercises - Supine Transversus Abdominis Bracing with Pelvic Floor Contraction  - 2 x daily - 7 x weekly - 1 sets -  10 reps - 10sec  hold - Supine Heel Slide  - 2 x daily - 7 x weekly - 1 sets - 10 reps - 1-2 sec  hold - Supine Quad Set  - 2 x daily - 7 x weekly - 1-2 sets - 10 reps - 3 sec  hold - Standing Hip Extension with Counter Support  - 2 x daily - 7 x weekly - 1-2 sets - 10 reps - 2-3 sec  hold - Standing Hip Abduction with Counter Support  - 1 x daily - 7 x weekly - 1-2 sets - 10 reps - 2-3 sec  hold  ASSESSMENT:  CLINICAL IMPRESSION: Patient reports that he slept on his R hip and has increased pain in the R hip this morning. Decreased pain following treatment. Feels some fatigue. He has been working on his exercises. Continued work today ROM, stretching hip flexors, worked on transitional movements and transfers as well as standing posture, alignment, balance. Working on gait focus on slow controlled step stop forward and back. Discussed importance of consistent exercise in recovery and encouraged patient to work on exercises at home. Reviewed HEP and progressed exercises in clinic. No new exercises for home.    EVAL: Patient is a 68 y.o. male who was seen today for physical therapy evaluation and treatment for  s/p R THA 10/22/23. He has a history of R hip pain progressively worsening over the past two years. Patient underwent elective R THA, anterior approach, 10/22/23 with no complications other than a seroma  R hip posterior to the incision as noted above. Patient has decreased ROM, mobility, strength R LE; abnormal gait pattern; decreased mobility and transfers; decreased functional activity level and inability to perform normal tasks required for work. Patient has a functional leg length difference with R LE in slight flexion compared to L in standing. Patient will benefit from PT to address problems identified.   OBJECTIVE IMPAIRMENTS: Abnormal gait, decreased activity tolerance, decreased balance, decreased mobility, decreased ROM, decreased strength, and pain.     GOALS: Goals reviewed with patient? Yes  SHORT TERM GOALS: Target date: 12/07/2023   Independent in initial HEP  Baseline: Goal status: INITIAL  2.  Improve gait pattern with patient to demonstrate normal weight bearing and weight transfer with weight bearing R LE   Baseline:  Goal status: INITIAL  3.  Patient demonstrates improved posture and alignment in standing with equal wt bearing LE's and no retraction of R hip in standing  Baseline:  Goal status: INITIAL  LONG TERM GOALS: Target date: 01/04/2024   Improve ROM and mobility R LE to WFL's  Baseline:  Goal status: INITIAL  2.  Improve core strength and stability allowing patient to stand, walk and exercise without LBP  Baseline:  Goal status: INITIAL  3.  Increased strength R LE to 4+/5 to 5/5 allowing patient to return to normal functional activities  Baseline:  Goal status: INITIAL  4.  Normal gait pattern with no assistive device, including ascending and descending steps  Baseline:  Goal status: INITIAL  5.  Improve LEFS by 10-20 points  Baseline: 14/80; 17.5%  Goal status: INITIAL  6.  Independent in HEP, including aquatic program as indicated  Baseline:   Goal status: INITIAL   PLAN:  PT FREQUENCY: 2x/week  PT DURATION: 8 weeks  PLANNED INTERVENTIONS: 97110-Therapeutic exercises, 97530- Therapeutic activity, [id number]- Neuromuscular re-education, 97535- Self Care, [id number]- Manual therapy, [id number]- Gait training, [phone]- Aquatic Therapy, [phone]- Electrical stimulation (unattended), [phone]- Ionotophoresis 4mg /ml  Dexamethasone, Patient/Family education, Balance training, Stair training, Taping, Dry Needling, Joint mobilization, Cryotherapy, and Moist heat  PLAN FOR NEXT SESSION: review and progress exercise; transfer and transitional activities; gait training; education and home instruction; manual work and modalities as indicated    [employer], PT 11/26/2023, 9:50 AM

## 2023-11-30 ENCOUNTER — Ambulatory Visit: Payer: PRIVATE HEALTH INSURANCE

## 2023-12-03 ENCOUNTER — Encounter: Payer: Self-pay | Admitting: Rehabilitative and Restorative Service Providers"

## 2023-12-03 ENCOUNTER — Encounter: Payer: Self-pay | Admitting: Orthopaedic Surgery

## 2023-12-03 ENCOUNTER — Ambulatory Visit: Payer: PRIVATE HEALTH INSURANCE | Admitting: Rehabilitative and Restorative Service Providers"

## 2023-12-03 ENCOUNTER — Ambulatory Visit (INDEPENDENT_AMBULATORY_CARE_PROVIDER_SITE_OTHER): Admitting: Orthopaedic Surgery

## 2023-12-03 DIAGNOSIS — Z96641 Presence of right artificial hip joint: Secondary | ICD-10-CM

## 2023-12-03 DIAGNOSIS — R2689 Other abnormalities of gait and mobility: Secondary | ICD-10-CM

## 2023-12-03 DIAGNOSIS — R29898 Other symptoms and signs involving the musculoskeletal system: Secondary | ICD-10-CM

## 2023-12-03 DIAGNOSIS — R293 Abnormal posture: Secondary | ICD-10-CM

## 2023-12-03 DIAGNOSIS — M6281 Muscle weakness (generalized): Secondary | ICD-10-CM

## 2023-12-03 NOTE — Progress Notes (Signed)
 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  He says he has good range of motion and strength and no pain at all.  His right hip incision looks good.  There is some swelling to be expected but overall the soft tissue looks good.  He is walking without any significant limp and gets up from a chair easily.  On exam his right hip moves smoothly and fluidly.  He still has a harder time with his ADLs like putting shoes and socks on but hopefully that will loosen up with time.  He had been dealing with arthritis for some time now.  From my standpoint we will see him back in 6 months unless he has any issues.  Will have a standing low AP pelvis and lateral of his right operative hip at that visit.

## 2023-12-03 NOTE — Therapy (Addendum)
 OUTPATIENT PHYSICAL THERAPY LOWER EXTREMITY TREATMENT PHYSICAL THERAPY DISCHARGE SUMMARY  Visits from Start of Care: 5  Current functional level related to goals / functional outcomes: See progress note for discharge status    Remaining deficits: Needs to continue with HEP    Education / Equipment: HEP    Patient agrees to discharge. Patient goals were met. Patient is being discharged due to meeting the stated rehab goals.  Lytle Malburg P. Ina PT, MPH 04/26/24 2:51 PM     Patient Name: Billy Dalton. MRN: [id number] DOB:08-Aug-1956, 68 y.o., male Today's Date: 12/03/2023  END OF SESSION:  PT End of Session - 12/03/23 0942     Visit Number 5    Number of Visits 16    Date for PT Re-Evaluation 01/04/24    Authorization Type not available    PT Start Time 0937    PT Stop Time 1015    PT Time Calculation (min) 38 min    Activity Tolerance Patient tolerated treatment well             Past Medical History:  Diagnosis Date   Diabetes mellitus without complication (HCC)    Hypertension    Osteoarthritis of right hip    Past Surgical History:  Procedure Laterality Date   LAMINECTOMY  08/18/2014   PROSTATE BIOPSY     yearly   TOTAL HIP ARTHROPLASTY Right 10/22/2023   Procedure: RIGHT TOTAL HIP ARTHROPLASTY ANTERIOR APPROACH;  Surgeon: Vernetta Lonni GRADE, MD;  Location: [facility] OR;  Service: Orthopedics;  Laterality: Right;   WISDOM TOOTH EXTRACTION     Patient Active Problem List   Diagnosis Date Noted   Status post total replacement of right hip 10/22/2023   OSA on CPAP 03/10/2017   Hyperlipidemia 07/20/2013    PCP: Dr Arnulfo Sharps  REFERRING PROVIDER: Dr Lonni Vernetta  REFERRING DIAG: s/p R THA  THERAPY DIAG:  Status post right hip replacement  Abnormal posture  Other symptoms and signs involving the musculoskeletal system  Muscle weakness (generalized)  Other abnormalities of gait and mobility  Rationale for Evaluation and Treatment:  Rehabilitation  ONSET DATE: 10/22/23  SUBJECTIVE:   SUBJECTIVE STATEMENT: Went to the gym this week to do some core work. Did OK. No machines. Anxious to be able to swim. Sees Dr Vernetta today and hopes to be released to swim. Can now sleep on either side. Keeps a pillow btn knees. Still has some LBP which comes and goes. Lift in shoe is fine. Thinks he should be able to walk and regain his strength.   EVAL: History of R hip pain over the past 2 years gradually worsening. Has a seroma anterior R hip but has not had post op complications.   PERTINENT HISTORY: HTN; AODM; chest pain with stress test - no blockage; lumbar discectomy L4/5 ~ 12 yrs ago; occasional R LB discomfort   PAIN:  Are you having pain? Yes: NPRS scale: 2/10 Pain location: R hip  Pain description: dull  Aggravating factors: end of the day  Relieving factors: meds; heat LB   PRECAUTIONS: Anterior hip   WEIGHT BEARING RESTRICTIONS: No  FALLS:  Has patient fallen in last 6 months? 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  DIAGNOSTIC FINDINGS: xray 10/22/23 - Right hip arthroplasty without immediate postoperative complication.   PATIENT SURVEYS:  LEFS 14/80; 17.5%     SENSATION: Superficial seroma with some abnormal sensation    EDEMA:  Superficial seroma posterior aspect of R hip ~ length of incision and ~ 6-8 cm in width; firm to touch   MUSCLE  LENGTH: Hamstrings: Right 45 deg; Left 35 deg Hip flexors - tight R > L  POSTURE: rounded shoulders, forward head, decreased lumbar lordosis, increased thoracic kyphosis, right pelvic obliquity, flexed trunk , and weight shift left; functional leg length difference in standing with R LE longer than L   PALPATION: Tenderness to palpation over the seroma R anterior/lateral hip  LOWER EXTREMITY ROM:  Active ROM Right eval Left eval  Hip flexion 90   Hip extension 0   Hip abduction 35   Hip adduction    Hip internal rotation 10   Hip external rotation 30   Knee flexion 120   Knee extension 0   Ankle dorsiflexion    Ankle plantarflexion    Ankle inversion    Ankle eversion     (Blank rows = not tested)  LOWER EXTREMITY MMT: not tested resistively; moves R LE well against gravity   MMT Right 12/03/23 Left eval  Hip flexion 4   Hip extension    Hip abduction 4   Hip adduction    Hip internal rotation    Hip external rotation    Knee flexion    Knee extension    Ankle dorsiflexion    Ankle plantarflexion    Ankle inversion    Ankle eversion     (Blank rows = not tested)   FUNCTIONAL TESTS:  5 times sit to stand: 12.21 sec  GAIT: Distance walked: 40 ft Assistive device utilized: None Level of assistance: Complete Independence Comments: limp R LE in wt bearing R with R hemipelvis posteriorly rotated    [facility] Adult PT Treatment:                                                DATE: 12/03/23 Therapeutic Exercise: Supine  Quad set to SLR focus on TKE 3 sec x 10 Bridge 5 sec x 5 Bridge extending knee alternating R/L 2 sec x 3  Trunk rotation in hooklying x 5  Hip flexor stretch supine 10 sec x 3  Sidelying  Hip abduction leading with heel x 10  Sitting Sit to stand no UE assist x 10 VC of therapist for neuromuscular re-education - working on equal wt bearing bilat LE's  Standing  Mini squat x 10  Neuromuscular re-ed: Sit to stand and stand to sit focus on equal  wt bearing bilat LE's  Standing working on equal wt bearing and wt shift working to improve wt shift and control wt to R   Therapeutic Activity:  Step up 6 inch step R x 10 x 2   Lateral heel tap 4 inch step standing on R x 5 some discomfort )( will start with 1 inch for HEP) QL stretch standing at door 10 sec x  5 QL stretch sitting leaning over bolster and pillow  Gait: Slow gait with verbal and tactile cues for alignment, wt shift, improved mechanics for gait walking forward; backward - step and stop steps x 30 ft x 3 each    [facility] Adult PT Treatment:                                                DATE: 11/26/23 Therapeutic Exercise: Supine  Supine hip extension with knee flexion for stretch through anterior hip 20-30 sec x 3   Sitting Sit to stand no UE assist x 5 use of mirror for neuromuscular re-education - working on equal wt bearing bilat LE's  Standing  Mini squat x 10  Manual Therapy: P/AAROM R hip into hip extension; hip rotation; hip ab/adduction  Neuromuscular re-ed: Sit to stand and stand to sit focus on equal wt bearing bilat LE's  Standing working on equal wt bearing and wt shift working to improve wt shift and control wt to R   Gait: Slow gait with verbal and tactile cues for alignment, wt shift, improved mechanics for gait walking forward; backward - step and stop steps x 30 ft x 3 each   [facility] Adult PT Treatment:                                                DATE: 11/23/23 Therapeutic Exercise: Supine  Quad set 3 sec x 10  SLR painful -  assist with strap trying to eccentrically lower x 10   Short arc quad 2-3 sec x 10  Bridge 2-3 sec x 10  Sidelying  Hip abduction leading with heel x 10 R Clamshell x 10 R Sitting Sit to stand no UE assist x 10 use of mirror for neuromuscular re-education - working on equal wt bearing bilat LE's  Standing  Hip extension x 10 UE support as needed R/L  Hip abduction leading with heel UE support as needed R/L  Marching UE support  as needed x 10 R/L Manual Therapy: STM over seroma to pt tolerance  Trial of kinesotaping seroma R anteriolateral thigh basket weave to assist  Neuromuscular re-ed: Sit to stand and stand to sit focus on equal wt bearing bilat LE's  Posterior hips at counter extension R LE with UE support 10-20 sec hold x 5  Therapeutic Activity: Gait: Gait with verbal and tactile cues for alignment, wt shift, improved mechanics for gait walking forward; backward - slow steps x 30 ft x 3 each     PATIENT EDUCATION:  Education details: POC; HEP  Person educated: Patient Education method: Programmer, multimedia, Demonstration, Tactile cues, Verbal cues, and Handouts Education comprehension: verbalized understanding, returned demonstration, verbal cues required, tactile cues required, and needs further education  HOME EXERCISE PROGRAM: Access Code: [email or web address] URL: [email or web address] Date: 12/03/2023 Prepared by: Raydan Schlabach  Program Notes working on alignment in standing   Exercises - Supine Transversus Abdominis Bracing with Pelvic Floor Contraction  - 2 x daily - 7 x weekly - 1 sets - 10 reps - 10sec  hold - Supine Heel Slide  - 2 x daily - 7 x weekly - 1 sets - 10 reps - 1-2 sec  hold - Supine Quad Set  -  2 x daily - 7 x weekly - 1-2 sets - 10 reps - 3 sec  hold - Standing Hip Extension with Counter Support  - 2 x daily - 7 x weekly - 1-2 sets - 10 reps - 2-3 sec  hold - Standing Hip Abduction with Counter Support  - 1 x daily - 7 x weekly - 1-2 sets - 10 reps - 2-3 sec  hold - Supine Knee Extension Strengthening  - 2 x daily - 7 x weekly - 1-2 sets - 10 reps - 5 sec  hold - Small Range Straight Leg Raise  - 2 x daily - 7 x weekly - 1 sets - 10 reps - 5 sec  hold - Sidelying Hip Abduction  - 1 x daily - 7 x weekly - 3 sets - 10 reps - 3-5 sec  hold - Clamshell with Resistance  - 2 x daily - 7 x weekly - 2 sets - 10 reps - 3 sec  hold - Supine Bridge  - 2 x daily - 7 x weekly - 1-2 sets - 10  reps - 5-10 sec  hold - Sit to Stand  - 2 x daily - 7 x weekly - 1 sets - 10 reps - 3-5 sec  hold - Supine Bridge with Leg Extension  - 2 x daily - 7 x weekly - 1 sets - 10 reps - 2-3 sec hold - Supine Lower Trunk Rotation  - 2 x daily - 7 x weekly - 1 sets - 3-5 reps - 20-30 sec  hold - Standing Quadratus Lumborum Stretch with Doorway  - 2 x daily - 7 x weekly - 1 sets - 3 reps - 30 sec  hold - Seated Quadratus Lumborum Stretch in Chair  - 2 x daily - 7 x weekly - 1 sets - 3 reps - 30 sec  hold - Step Up  - 2 x daily - 7 x weekly - 1 sets - 10 reps - 2 sec  hold - Lateral Step Up  - 2 x daily - 7 x weekly - 2 sets - 10 reps - 2 sec  hold - Hip Flexor Stretch at Edge of Bed  - 2 x daily - 7 x weekly - 1 sets - 3 reps - 30 sec  hold  ASSESSMENT:  CLINICAL IMPRESSION: Patient reports continued improvement with less pain in the hip has continued pain in R LB on an intermittent basis. Added specific stretches for R QL in standing and sitting. Continued with stretching, strengthening and neuromuscular re-ed for R hip; standing and walking. He can now sleep on R hip. Continued work today ROM, stretching hip flexors, worked on transitional movements and transfers as well as standing posture, alignment, balance. Working on gait focus on slow controlled step stop forward and back. Discussed importance of consistent exercise in recovery and encouraged patient to work on exercises at home. Reviewed HEP and progressed exercises in clinic. Will benefit from continued treatment to accomplish goals of rehab.    EVAL: Patient is a 68 y.o. male who was seen today for physical therapy evaluation and treatment for s/p R THA 10/22/23. He has a history of R hip pain progressively worsening over the past two years. Patient underwent elective R THA, anterior approach, 10/22/23 with no complications other than a seroma  R hip posterior to the incision as noted above. Patient has decreased ROM, mobility, strength R LE; abnormal  gait pattern; decreased mobility and transfers; decreased  functional activity level and inability to perform normal tasks required for work. Patient has a functional leg length difference with R LE in slight flexion compared to L in standing. Patient will benefit from PT to address problems identified.   OBJECTIVE IMPAIRMENTS: Abnormal gait, decreased activity tolerance, decreased balance, decreased mobility, decreased ROM, decreased strength, and pain.     GOALS: Goals reviewed with patient? Yes  SHORT TERM GOALS: Target date: 12/07/2023   Independent in initial HEP  Baseline: Goal status: INITIAL  2.  Improve gait pattern with patient to demonstrate normal weight bearing and weight transfer with weight bearing R LE   Baseline:  Goal status: INITIAL  3.  Patient demonstrates improved posture and alignment in standing with equal wt bearing LE's and no retraction of R hip in standing  Baseline:  Goal status: INITIAL  LONG TERM GOALS: Target date: 01/04/2024   Improve ROM and mobility R LE to WFL's  Baseline:  Goal status: INITIAL  2.  Improve core strength and stability allowing patient to stand, walk and exercise without LBP  Baseline:  Goal status: INITIAL  3.  Increased strength R LE to 4+/5 to 5/5 allowing patient to return to normal functional activities  Baseline:  Goal status: INITIAL  4.  Normal gait pattern with no assistive device, including ascending and descending steps  Baseline:  Goal status: INITIAL  5.  Improve LEFS by 10-20 points  Baseline: 14/80; 17.5%  Goal status: INITIAL  6.  Independent in HEP, including aquatic program as indicated  Baseline:  Goal status: INITIAL   PLAN:  PT FREQUENCY: 2x/week  PT DURATION: 8 weeks  PLANNED INTERVENTIONS: 97110-Therapeutic exercises, 97530- Therapeutic activity, 97112- Neuromuscular re-education, [phone]- Self Care, 02859- Manual therapy, [phone]- Gait training, [phone]- Aquatic Therapy, [phone]- Electrical  stimulation (unattended), [phone]- Ionotophoresis 4mg /ml Dexamethasone , Patient/Family education, Balance training, Stair training, Taping, Dry Needling, Joint mobilization, Cryotherapy, and Moist heat  PLAN FOR NEXT SESSION: review and progress exercise; transfer and transitional activities; gait training; education and home instruction; manual work and modalities as indicated    Levy Cedano P Osbaldo Mark, PT 12/03/2023, 9:43 AM

## 2023-12-14 ENCOUNTER — Encounter: Payer: Self-pay | Admitting: Student in an Organized Health Care Education/Training Program

## 2023-12-14 ENCOUNTER — Ambulatory Visit (INDEPENDENT_AMBULATORY_CARE_PROVIDER_SITE_OTHER): Payer: PRIVATE HEALTH INSURANCE | Admitting: Student in an Organized Health Care Education/Training Program

## 2023-12-14 VITALS — BP 134/68

## 2023-12-14 DIAGNOSIS — G4733 Obstructive sleep apnea (adult) (pediatric): Secondary | ICD-10-CM | POA: Diagnosis not present

## 2023-12-14 DIAGNOSIS — I2510 Atherosclerotic heart disease of native coronary artery without angina pectoris: Secondary | ICD-10-CM | POA: Insufficient documentation

## 2023-12-14 DIAGNOSIS — E782 Mixed hyperlipidemia: Secondary | ICD-10-CM

## 2023-12-14 DIAGNOSIS — Z0000 Encounter for general adult medical examination without abnormal findings: Secondary | ICD-10-CM | POA: Insufficient documentation

## 2023-12-14 DIAGNOSIS — I10 Essential (primary) hypertension: Secondary | ICD-10-CM | POA: Insufficient documentation

## 2023-12-14 DIAGNOSIS — E119 Type 2 diabetes mellitus without complications: Secondary | ICD-10-CM | POA: Diagnosis not present

## 2023-12-14 DIAGNOSIS — R972 Elevated prostate specific antigen [PSA]: Secondary | ICD-10-CM | POA: Insufficient documentation

## 2023-12-14 LAB — POCT GLYCOSYLATED HEMOGLOBIN (HGB A1C): Hemoglobin A1C: 6.7 % — AB (ref 4.0–5.6)

## 2023-12-14 NOTE — Assessment & Plan Note (Signed)
 Chronic and stable.  Doing great today with an A1c of 6.7%.  He had some GI side effects to metformin , so discontinued this a few months ago.  With his A1c I think it is okay to continue lifestyle modifications alone for management of his diabetes.  He is on atorvastatin  for primary prevention of an ischemic event.  Not currently using a RAAS inhibitor.

## 2023-12-14 NOTE — Assessment & Plan Note (Signed)
 Chronic and stable.  Patient tolerates nocturnal CPAP well.  We talked about the approval from [employer] as medical management of OSA, which may be a nice option for him in the future.

## 2023-12-14 NOTE — Assessment & Plan Note (Addendum)
 Up-to-date with colon cancer screening.  Had a colonoscopy at age 68 and then a normal Cologuard test in 2023.  Shingles vaccination done 2020 with 1 dose.

## 2023-12-14 NOTE — Progress Notes (Signed)
 New Patient Office Visit  Subjective    Patient ID: Billy Dalton., male    DOB: [birth year]  Age: 68 y.o. MRN: [id number]  CC:   Chief Complaint  Patient presents with   Establish Care    Patient states new diagnosis of diabetes and has come questions regarding this     HPI  Billy Dalton. 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  The patient lives in [place] with his wife and 3 teenage children who attend [employer].  He has a outpatient practice in [place] and does inpatient work at [employer].  He is a Manufacturing engineer.  Has been off the last few months after having a total hip replacement with Dr. Lucienne Ryder in March.  He is recovering really nicely from that surgery, planning to return to work very soon.  He reports having a very active lifestyle, likes to swim most days of the week at a local gym.  He has been working hard on his nutrition, made a lot of dietary changes, and has seen 15 pounds of weight loss over the last few months.  No chest pain, pressure, shortness of breath, or other recent illness.    Outpatient Encounter Medications as of 12/14/2023  Medication Sig   Ascorbic Acid (VITAMIN C PO) Take 1 tablet by mouth every Monday, Tuesday, Wednesday, Thursday, and Friday.   aspirin  EC 81 MG tablet Take 81 mg by mouth daily. Swallow whole.   atorvastatin  (LIPITOR) 80 MG tablet Take 80 mg by mouth every evening.   metoprolol  succinate (TOPROL -XL) 25 MG 24 hr tablet Take 25 mg by mouth in the morning.   tadalafil (CIALIS) 10 MG tablet Take 10 mg by mouth daily as needed for erectile dysfunction.   tamsulosin  (FLOMAX ) 0.4 MG CAPS capsule Take 0.4 mg by mouth at bedtime.   triamterene -hydrochlorothiazide  (MAXZIDE -25) 37.5-25 MG tablet Take 1 tablet by mouth in the morning.   verapamil  (CALAN -SR) 240 MG CR tablet Take 240 mg by mouth at bedtime.    tetrahydrozoline 0.05 % ophthalmic solution Place 1-2 drops into both eyes 3 (three) times daily as needed (dry/irritated eyes.).   [DISCONTINUED] chlorhexidine  (HIBICLENS ) 4 % external liquid Apply 15 mLs (1 Application total) topically as directed for 30 doses. Use as directed daily for 5 days every other week for 6 weeks. (Patient not taking: Reported on 11/09/2023)   [DISCONTINUED] metFORMIN  (GLUCOPHAGE -XR) 500 MG 24 hr tablet Take 500 mg by mouth every evening. (Patient not taking: Reported on 12/14/2023)   [DISCONTINUED] methocarbamol  (ROBAXIN ) 500 MG tablet Take 1 tablet (500 mg total) by mouth every 6 (six) hours as needed for muscle spasms. (Patient not taking: Reported on 11/09/2023)   [DISCONTINUED] oxyCODONE  (OXY IR/ROXICODONE ) 5 MG immediate release tablet Take 1-2 tablets (5-10 mg total) by mouth every 6 (six) hours as needed for severe pain (pain score 7-10). (Patient not taking: Reported on 12/14/2023)   No facility-administered encounter medications on file as of 12/14/2023.    Past Medical History:  Diagnosis Date   Diabetes mellitus without complication (HCC)    Hypertension    Osteoarthritis of right hip     Past Surgical History:  Procedure Laterality Date   LAMINECTOMY  08/18/2014   PROSTATE BIOPSY     yearly   TOTAL HIP ARTHROPLASTY Right 10/22/2023   Procedure: RIGHT TOTAL HIP ARTHROPLASTY ANTERIOR APPROACH;  Surgeon: Arnie Lao, MD;  Location: [facility] OR;  Service: Orthopedics;  Laterality: Right;   WISDOM TOOTH EXTRACTION      Family History  Problem Relation Age of Onset   Diabetes Mother    Hypertension Mother    Diabetes Father    Hypertension Father        Objective    BP 134/68   Physical Exam  Gen: Well-appearing man Eyes: Normal Ears: Normal bilateral tympanic membranes Neck: Normal thyroid, no nodules or adenopathy Heart: Regular, no murmur Lungs: Unlabored, clear to auscultation throughout Abd: Soft, small umbilical hernia that is  reducible, no organomegaly Ext: Warm, no edema, normal joints Neuro: Alert, conversational, full strength in upper and lower extremities, normal get up and go, normal gait Psych: Appropriate mood and affect, very pleasant to talk with, not depressed or anxious appearing.      Assessment & Plan:   Problem List Items Addressed This Visit       High   Hypertension (Chronic)   Chronic and stable.  Blood pressure is 134/68, which is just a little over my goal for him.  Last BMP in March showed normal renal function.  Currently using triamterene /HCTZ and verapamil .  I am going to talk with him more about considering a change to add an ARB which would give us  better renal protection given comorbid diabetes.  Blood pressure goal for him would be less than 130/80 given history of atherosclerosis and diabetes.      Relevant Medications   aspirin  EC 81 MG tablet   Diabetes mellitus without complication (HCC) - Primary (Chronic)   Chronic and stable.  Doing great today with an A1c of 6.7%.  He had some GI side effects to metformin , so discontinued this a few months ago.  With his A1c I think it is okay to continue lifestyle modifications alone for management of his diabetes.  He is on atorvastatin  for primary prevention of an ischemic event.  Not currently using a RAAS inhibitor.       Relevant Medications   aspirin  EC 81 MG tablet   Other Relevant Orders   POCT glycosylated hemoglobin (Hb A1C) (Completed)     Medium    Hyperlipidemia (Chronic)   Chronic and stable.  Tolerating atorvastatin  80 mg daily well.  No side effects.  Will check lipids today.  Patient has set a goal of LDL less than 70 which is reasonable.      Relevant Medications   aspirin  EC 81 MG tablet   Other Relevant Orders   Lipid panel   OSA on CPAP (Chronic)   Chronic and stable.  Patient tolerates nocturnal CPAP well.  We talked about the approval from [employer] as medical management of OSA, which may be a nice option  for him in the future.      Coronary atherosclerosis (Chronic)   Chronic and stable.  Patient had a history of an abnormal stress evaluation, then had a cardiac catheterization in 2021.  This showed nonobstructive coronary artery disease with a 40% OM1 lesion.  He has never had a infarction event.  Plan to continue with medical management with atorvastatin  80 mg daily and metoprolol  25 mg daily.  Patient also is using aspirin  81 mg daily, no side effects.  Other risk factors are well-controlled including hypertension and diabetes.      Relevant Medications   aspirin  EC 81 MG tablet   Elevated PSA (Chronic)   Managed by Dr. Rozanne Corners with [facility].  Patient undergoing annual active surveillance with prostate biopsies.  Low   Healthcare maintenance (Chronic)   Up-to-date with colon cancer screening.  Had a colonoscopy at age 8 and then a normal Cologuard test in 2023.  Shingles vaccination done 2020 with 1 dose.         Return in about 3 months (around 03/14/2024) for diabetes management.   Ether Hercules, MD

## 2023-12-14 NOTE — Assessment & Plan Note (Signed)
 Chronic and stable.  Tolerating atorvastatin  80 mg daily well.  No side effects.  Will check lipids today.  Patient has set a goal of LDL less than 70 which is reasonable.

## 2023-12-14 NOTE — Assessment & Plan Note (Signed)
 Managed by Dr. Rozanne Corners with [facility].  Patient undergoing annual active surveillance with prostate biopsies.

## 2023-12-14 NOTE — Assessment & Plan Note (Signed)
 Chronic and stable.  Blood pressure is 134/68, which is just a little over my goal for him.  Last BMP in March showed normal renal function.  Currently using triamterene /HCTZ and verapamil .  I am going to talk with him more about considering a change to add an ARB which would give us  better renal protection given comorbid diabetes.  Blood pressure goal for him would be less than 130/80 given history of atherosclerosis and diabetes.

## 2023-12-14 NOTE — Assessment & Plan Note (Signed)
 Chronic and stable.  Patient had a history of an abnormal stress evaluation, then had a cardiac catheterization in 2021.  This showed nonobstructive coronary artery disease with a 40% OM1 lesion.  He has never had a infarction event.  Plan to continue with medical management with atorvastatin  80 mg daily and metoprolol  25 mg daily.  Patient also is using aspirin  81 mg daily, no side effects.  Other risk factors are well-controlled including hypertension and diabetes.

## 2023-12-15 ENCOUNTER — Encounter: Payer: Self-pay | Admitting: Student in an Organized Health Care Education/Training Program

## 2023-12-15 LAB — LIPID PANEL
Cholesterol: 129 mg/dL (ref 0–200)
HDL: 37.6 mg/dL — ABNORMAL LOW (ref >39.00)
LDL Cholesterol: 74 mg/dL (ref 0–99)
NonHDL: 91.31
Total CHOL/HDL Ratio: 3
Triglycerides: 88 mg/dL (ref 0.0–149.0)
VLDL: 17.6 mg/dL (ref 0.0–40.0)

## 2024-03-14 ENCOUNTER — Ambulatory Visit: Admitting: Student in an Organized Health Care Education/Training Program

## 2024-03-21 ENCOUNTER — Encounter: Payer: Self-pay | Admitting: Student in an Organized Health Care Education/Training Program

## 2024-05-13 ENCOUNTER — Telehealth: Payer: Self-pay

## 2024-05-13 NOTE — Telephone Encounter (Signed)
 Type of form received: [facility] Statement Verification   Additional comments:   Received by: In office drop off   Form should be Faxed/mailed to: Do not fax  Is patient requesting call for pickup: YES   Form placed:  On provider desk for signature   Attach charge sheet.  Provider will determine charge.  Individual made aware of 3-5 business day turn around Yes?

## 2024-05-13 NOTE — Telephone Encounter (Signed)
 Per willow she will call patient to pick up form.

## 2024-05-13 NOTE — Telephone Encounter (Signed)
 Per willow document was faxed and scanned to patients chart. Placed in scanned folder up front

## 2024-05-13 NOTE — Telephone Encounter (Signed)
Called patient to let him know forms are ready for pick up. 

## 2024-05-20 ENCOUNTER — Telehealth: Payer: Self-pay | Admitting: Student in an Organized Health Care Education/Training Program

## 2024-05-20 MED ORDER — VERAPAMIL HCL ER 240 MG PO TBCR: 240.0000 mg | EXTENDED_RELEASE_TABLET | Freq: Every day | ORAL | 3 refills | Status: DC

## 2024-05-20 NOTE — Telephone Encounter (Signed)
 Copied from CRM #[id number]. Topic: Clinical - Medication Refill >> May 20, 2024 12:47 PM Dedra B wrote: Medication: verapamil  (CALAN -SR) 240 MG CR tablet  Has the patient contacted their pharmacy? No, out of refills  This is the patient's preferred pharmacy:   [id number] [place] [place] - [address] [place] [address] [place] [address] [place]  [address] Phone: [phone] Fax: [phone]  Is this the correct pharmacy for this prescription? Yes  Has the prescription been filled recently? No  Is the patient out of the medication? Yes, he's been out a week  Has the patient been seen for an appointment in the last year OR does the patient have an upcoming appointment? Yes  Can we respond through MyChart? Yes  Agent: Please be advised that Rx refills may take up to 3 business days. We ask that you follow-up with your pharmacy.

## 2024-05-20 NOTE — Telephone Encounter (Signed)
 Okay to refill medication under your name?

## 2024-06-02 ENCOUNTER — Ambulatory Visit: Admitting: Orthopaedic Surgery

## 2024-06-03 ENCOUNTER — Encounter: Payer: Self-pay | Admitting: Urology

## 2024-06-03 ENCOUNTER — Other Ambulatory Visit: Payer: Self-pay | Admitting: Urology

## 2024-06-03 DIAGNOSIS — C61 Malignant neoplasm of prostate: Secondary | ICD-10-CM

## 2024-06-13 ENCOUNTER — Other Ambulatory Visit (HOSPITAL_COMMUNITY): Payer: Self-pay | Admitting: Urology

## 2024-06-13 DIAGNOSIS — C61 Malignant neoplasm of prostate: Secondary | ICD-10-CM

## 2024-06-20 ENCOUNTER — Encounter: Payer: Self-pay | Admitting: Radiology

## 2024-07-11 ENCOUNTER — Other Ambulatory Visit: Payer: Self-pay | Admitting: Student in an Organized Health Care Education/Training Program

## 2024-07-11 NOTE — Telephone Encounter (Signed)
 Okay to refill under your name for 90D supply?

## 2024-07-11 NOTE — Telephone Encounter (Unsigned)
 Copied from CRM #[id number]. Topic: Clinical - Medication Refill >> Jul 11, 2024  3:52 PM Eva FALCON wrote: Medication:  tamsulosin  (FLOMAX ) 0.4 MG CAPS capsule - is requesting a 90 day supply instead of 30 days so he  doesn't have to keep calling every 30 days.    Has the patient contacted their pharmacy? Yes, no refills left from pharmacy  (Agent: If no, request that the patient contact the pharmacy for the refill. If patient does not wish to contact the pharmacy document the reason why and proceed with request.) (Agent: If yes, when and what did the pharmacy advise?)  This is the patient's preferred pharmacy:  [id number] - [place], [place] - [address] [place] [address] [place] [address] [place] [address] Phone: [phone] Fax: [phone]  Is this the correct pharmacy for this prescription? Yes If no, delete pharmacy and type the correct one.   Has the prescription been filled recently? No  Is the patient out of the medication? Yes  Has the patient been seen for an appointment in the last year OR does the patient have an upcoming appointment? Yes  Can we respond through MyChart? Yes  Agent: Please be advised that Rx refills may take up to 3 business days. We ask that you follow-up with your pharmacy.

## 2024-07-12 MED ORDER — TAMSULOSIN HCL 0.4 MG PO CAPS: 0.4000 mg | ORAL_CAPSULE | Freq: Every day | ORAL | 3 refills | Status: DC

## 2024-07-25 ENCOUNTER — Ambulatory Visit: Payer: PRIVATE HEALTH INSURANCE | Admitting: Student in an Organized Health Care Education/Training Program

## 2024-07-25 ENCOUNTER — Encounter: Payer: Self-pay | Admitting: Student in an Organized Health Care Education/Training Program

## 2024-07-25 VITALS — BP 148/82 | HR 68 | Wt 230.0 lb

## 2024-07-25 DIAGNOSIS — Z23 Encounter for immunization: Secondary | ICD-10-CM

## 2024-07-25 DIAGNOSIS — I10 Essential (primary) hypertension: Secondary | ICD-10-CM

## 2024-07-25 DIAGNOSIS — E119 Type 2 diabetes mellitus without complications: Secondary | ICD-10-CM

## 2024-07-25 LAB — POCT GLYCOSYLATED HEMOGLOBIN (HGB A1C): Hemoglobin A1C: 7.8 % — AB (ref 4.0–5.6)

## 2024-07-25 MED ORDER — LOSARTAN POTASSIUM-HCTZ 50-12.5 MG PO TABS: 1.0000 | ORAL_TABLET | Freq: Every day | ORAL | 1 refills | Status: DC

## 2024-07-25 MED ORDER — EMPAGLIFLOZIN 10 MG PO TABS: 10.0000 mg | ORAL_TABLET | Freq: Every day | ORAL | 3 refills | Status: DC

## 2024-07-25 NOTE — Assessment & Plan Note (Signed)
 A1c increased to 7.8%. Previously on metformin , which was discontinued due to gastrointestinal side effects. He is not currently on diabetes medication. Jardiance  was chosen for A1c reduction and oral administration. Potential side effects of GLP-1 agonists and benefits of SGLT2 inhibitors were discussed. Started Jardiance  10 mg orally daily. Check A1c every 3 months.

## 2024-07-25 NOTE — Patient Instructions (Signed)
  VISIT SUMMARY: Today, you had a follow-up visit to review your blood pressure and A1c levels, as well as to discuss your overall health maintenance. We also addressed your diabetes management, hypertension, and general health maintenance needs.  YOUR PLAN: -TYPE 2 DIABETES MELLITUS: Type 2 diabetes is a condition where your body does not use insulin properly, leading to high blood sugar levels. Your A1c has increased to 7.8%. Since you had side effects from metformin , we have started you on Jardiance  10 mg taken orally once daily. We will check your A1c every 3 months to monitor your progress.  -HYPERTENSION: Hypertension, or high blood pressure, can increase your risk of heart disease and stroke. Your blood pressure is currently 148/82 mmHg. We have switched your medication from Maxzide  to Hyzaar 50/12.5 mg taken orally once daily to help better control your blood pressure and protect your kidneys. We will check your blood pressure and basic metabolic panel (BMP) in one month to ensure the new medication is working effectively.  -GENERAL HEALTH MAINTENANCE: You received the second dose of the Shingrix  vaccine today, which helps protect against shingles. Potential side effects were discussed.  INSTRUCTIONS: Please take Jardiance  10 mg orally once daily for your diabetes and Hyzaar 50/25 mg orally once daily for your hypertension. We will check your A1c every 3 months and your blood pressure and BMP in one month. If you experience any side effects or have any concerns, please contact our office.

## 2024-07-25 NOTE — Progress Notes (Signed)
 Established Patient Office Visit  Patient ID: Billy Dalton., male    DOB: 12/02/1955  Age: 68 y.o. MRN: [id number] PCP: Jerrell Cleatus Ned, MD  Chief Complaint  Patient presents with   Diabetes    3 month follow up   Went over health maintenance with patient and has not completed care gaps below     Subjective:     HPI  Discussed the use of AI scribe software for clinical note transcription with the patient, who gave verbal consent to proceed.  History of Present Illness Billy Dalton. is a 68 year old male with a history of diabetes, hypertension, and non-obstructive coronary artery disease who presents for a preventative follow-up visit.  He underwent hip replacement surgery without complications and is here for a preventative follow-up, focusing on blood pressure and A1c levels. His A1c is currently 7.8%, up from 6.7% in April. He has not been on diabetes medication recently due to side effects from metformin , which caused loose stools. He is managing his weight through diet, having lost 14 pounds by avoiding junk food and eating salads and vegetables.  He has a history of hypertension and is currently on Maxzide  (triamterene  HCTZ), verapamil , and metoprolol . He does not recall any issues with ARBs.  He has a history of non-obstructive coronary artery disease with 20-30% stenosis and is currently taking aspirin . He does not recall any significant ischemic events.  He has a tender umbilical hernia, which is bothersome when pressed or hit, but he does not report any other symptoms related to it.  No episodes of diabetic ketoacidosis (DKA) in the past.     Objective:     BP (!) 148/82   Pulse 68   Wt 230 lb (104.3 kg)   BMI 34.46 kg/m   Physical Exam  Gen: Well-appearing man Neck: Normal thyroid, no nodules or adenopathy Heart: Regular, 2 out of 6 early systolic murmur best heard at the right upper sternal border Lungs; unlabored, clear throughout Abd:  Soft, mildly tender umbilical hernia is small, with no overlying skin changes Ext: Warm, no edema, compression socks in place   Results for orders placed or performed in visit on 07/25/24  POCT glycosylated hemoglobin (Hb A1C)  Result Value Ref Range   Hemoglobin A1C 7.8 (A) 4.0 - 5.6 %   HbA1c POC (<> result, manual entry)     HbA1c, POC (prediabetic range)     HbA1c, POC (controlled diabetic range)       Assessment & Plan:   Problem List Items Addressed This Visit       High   Hypertension (Chronic)   Blood pressure is elevated at 148/82 mmHg. Current regimen includes Maxzide , verapamil , and metoprolol , but no ARB. Plan to switch Maxzide  to Hyzaar for better blood pressure control and renal protection. Switched Maxzide  to Hyzaar 50/25 mg orally daily. Check BMP one month after starting Hyzaar. Follow-up in one month for blood pressure check and BMP.      Relevant Medications   losartan -hydrochlorothiazide  (HYZAAR) 50-12.5 MG tablet   Diabetes mellitus without complication (HCC) - Primary (Chronic)   A1c increased to 7.8%. Previously on metformin , which was discontinued due to gastrointestinal side effects. He is not currently on diabetes medication. Jardiance  was chosen for A1c reduction and oral administration. Potential side effects of GLP-1 agonists and benefits of SGLT2 inhibitors were discussed. Started Jardiance  10 mg orally daily. Check A1c every 3 months.      Relevant Medications   losartan -hydrochlorothiazide  (  HYZAAR) 50-12.5 MG tablet   empagliflozin  (JARDIANCE ) 10 MG TABS tablet   Other Relevant Orders   POCT glycosylated hemoglobin (Hb A1C) (Completed)   Other Visit Diagnoses       Need for shingles vaccine       Relevant Orders   Varicella-zoster vaccine IM (Completed)       Return in about 4 weeks (around 08/22/2024).    Cleatus Debby Specking, MD [facility]

## 2024-07-25 NOTE — Assessment & Plan Note (Addendum)
 Blood pressure is elevated at 148/82 mmHg. Current regimen includes Maxzide , verapamil , and metoprolol , but no ARB. Plan to switch Maxzide  to Hyzaar for better blood pressure control and renal protection. Switched Maxzide  to Hyzaar 50/25 mg orally daily. Check BMP one month after starting Hyzaar. Follow-up in one month for blood pressure check and BMP.

## 2024-07-27 ENCOUNTER — Telehealth: Payer: Self-pay | Admitting: Student in an Organized Health Care Education/Training Program

## 2024-07-27 DIAGNOSIS — H9113 Presbycusis, bilateral: Secondary | ICD-10-CM | POA: Insufficient documentation

## 2024-07-27 NOTE — Telephone Encounter (Signed)
 Patient would like to go to [facility]. Okay to place referral? Last visit with you was 07/25/24

## 2024-07-27 NOTE — Telephone Encounter (Signed)
 Called patient to inform him the referral has been placed, Patient verbalized understanding   I did let patient know it can take a week for them to reach out to schedule

## 2024-07-27 NOTE — Telephone Encounter (Signed)
I have placed the referral. Thank you!

## 2024-07-27 NOTE — Addendum Note (Signed)
 Addended by: JERRELL SOLIAN T on: 07/27/2024 12:27 PM   Modules accepted: Orders

## 2024-07-27 NOTE — Telephone Encounter (Signed)
 Copied from CRM #[id number]. Topic: Referral - Request for Referral >> Jul 27, 2024  9:12 AM Viola FALCON wrote:  Did the patient discuss referral with their provider in the last year? Yes (If No - schedule appointment) (If Yes - send message)  Appointment offered? No  Type of order/referral and detailed reason for visit: hearing loss   Preference of office, provider, location: [facility]   If referral order, have you been seen by this specialty before? No (If Yes, this issue or another issue? When? Where?  Can we respond through MyChart? No, patient would prefer a phone call to let him know when the referral is in. He's off work for 2 weeks and would like this done as soon as possible

## 2024-08-05 ENCOUNTER — Ambulatory Visit (HOSPITAL_COMMUNITY)
Admission: RE | Admit: 2024-08-05 | Discharge: 2024-08-05 | Disposition: A | Discharge status: Home / Self Care | Payer: PRIVATE HEALTH INSURANCE | Source: Ambulatory Visit | Attending: Urology | Admitting: Urology

## 2024-08-05 DIAGNOSIS — C61 Malignant neoplasm of prostate: Secondary | ICD-10-CM | POA: Insufficient documentation

## 2024-08-05 MED ORDER — GADOBUTROL 1 MMOL/ML IV SOLN
10.0000 mL | Freq: Once | INTRAVENOUS | Status: AC | PRN
  Administered 2024-08-05: 10 mL via INTRAVENOUS

## 2024-08-16 ENCOUNTER — Other Ambulatory Visit: Payer: Self-pay | Admitting: Student in an Organized Health Care Education/Training Program

## 2024-08-16 ENCOUNTER — Institutional Professional Consult (permissible substitution) (INDEPENDENT_AMBULATORY_CARE_PROVIDER_SITE_OTHER): Payer: PRIVATE HEALTH INSURANCE | Admitting: Otolaryngology

## 2024-08-16 DIAGNOSIS — I10 Essential (primary) hypertension: Secondary | ICD-10-CM

## 2024-08-22 ENCOUNTER — Ambulatory Visit: Admitting: Orthopaedic Surgery

## 2024-08-25 ENCOUNTER — Ambulatory Visit: Payer: PRIVATE HEALTH INSURANCE | Admitting: Student in an Organized Health Care Education/Training Program

## 2024-08-25 ENCOUNTER — Encounter: Payer: Self-pay | Admitting: Student in an Organized Health Care Education/Training Program

## 2024-08-25 ENCOUNTER — Ambulatory Visit (INDEPENDENT_AMBULATORY_CARE_PROVIDER_SITE_OTHER): Payer: PRIVATE HEALTH INSURANCE | Admitting: Student in an Organized Health Care Education/Training Program

## 2024-08-25 VITALS — BP 135/70 | HR 60

## 2024-08-25 DIAGNOSIS — R972 Elevated prostate specific antigen [PSA]: Secondary | ICD-10-CM | POA: Diagnosis not present

## 2024-08-25 DIAGNOSIS — E119 Type 2 diabetes mellitus without complications: Secondary | ICD-10-CM

## 2024-08-25 DIAGNOSIS — E782 Mixed hyperlipidemia: Secondary | ICD-10-CM | POA: Diagnosis not present

## 2024-08-25 DIAGNOSIS — Z7984 Long term (current) use of oral hypoglycemic drugs: Secondary | ICD-10-CM | POA: Diagnosis not present

## 2024-08-25 DIAGNOSIS — I10 Essential (primary) hypertension: Secondary | ICD-10-CM

## 2024-08-25 MED ORDER — LOSARTAN POTASSIUM-HCTZ 50-12.5 MG PO TABS: 1.0000 | ORAL_TABLET | Freq: Every day | ORAL | 3 refills | Status: DC

## 2024-08-25 MED ORDER — METOPROLOL SUCCINATE ER 25 MG PO TB24: 25.0000 mg | ORAL_TABLET | Freq: Every morning | ORAL | 3 refills | Status: DC

## 2024-08-25 MED ORDER — EMPAGLIFLOZIN 10 MG PO TABS: 10.0000 mg | ORAL_TABLET | Freq: Every day | ORAL | 3 refills | Status: DC

## 2024-08-25 MED ORDER — ATORVASTATIN CALCIUM 80 MG PO TABS: 80.0000 mg | ORAL_TABLET | Freq: Every evening | ORAL | 3 refills | Status: DC

## 2024-08-25 MED ORDER — VERAPAMIL HCL ER 240 MG PO TBCR: 240.0000 mg | EXTENDED_RELEASE_TABLET | Freq: Every day | ORAL | 3 refills | Status: DC

## 2024-08-25 MED ORDER — TADALAFIL 10 MG PO TABS: 10.0000 mg | ORAL_TABLET | Freq: Every day | ORAL | 3 refills | Status: DC | PRN

## 2024-08-25 MED ORDER — TAMSULOSIN HCL 0.4 MG PO CAPS: 0.4000 mg | ORAL_CAPSULE | Freq: Every day | ORAL | 3 refills | Status: DC

## 2024-08-25 NOTE — Assessment & Plan Note (Signed)
 The manage with urology. Recent MRI showed prostate enlargement and elevated PSA from 4 to 6. Conservative treatment options and surgical risks were discussed, emphasizing the low-grade nature and potential for good outcomes. A biopsy will be scheduled to assess the Gleason score. Consider conservative treatment options if the Gleason score is low and PSA rises slowly.

## 2024-08-25 NOTE — Assessment & Plan Note (Signed)
 Diabetes is managed with Jardiance . He reports increased urine output and weight loss. An A1c recheck is planned. Continue Jardiance  and recheck A1c in 2-3 months.

## 2024-08-25 NOTE — Progress Notes (Signed)
 "  Established Patient Office Visit  Patient ID: Billy Bracknell., male    DOB: [birth year]  Age: 69 y.o. MRN: [id number] PCP: Jerrell Cleatus Ned, MD  Chief Complaint  Patient presents with   Diabetes    4 week follow up     Subjective:     HPI  Discussed the use of AI scribe software for clinical note transcription with the patient, who gave verbal consent to proceed.  History of Present Illness Billy Soward. is a 69 year old male with hypertension and benign prostatic hyperplasia who presents for follow-up of his conditions.  He has noticed an increase in urine output since starting Empagliflozin  (Jardiance ). Blood pressure readings at home are typically around 135/70 mmHg, with a heart rate between 55 to 62 bpm. He recently switched from Maxzide  to Losartan -hydrochlorothiazide  (Hyzaar) and has not experienced any side effects such as lightheadedness upon standing. He finds the monthly medication refills inconvenient and prefers a three-month supply.  He is currently taking Tamsulosin  (Flomax ) for benign prostatic hyperplasia symptoms, which he describes as manageable but notes that urination is 'really slow' without the medication. He has undergone two MRIs and three biopsies for prostate monitoring. PSA levels have increased from 4 to 6, and he reports that the MRI was done for further evaluation. His father had his prostate removed at a younger age, indicating a family history of prostate issues.  He maintains an active lifestyle, walking three miles daily and swimming at least three times a week. He has noticed a decrease in weight post-holidays. 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     Objective:     BP 135/70   Pulse 60   SpO2 98%   Physical Exam  Gen: Well-appearing man Neck: Normal thyroid, no nodules  or adenopathy Heart: Regular, no murmur Ext: Warm, trace pitting edema in both lower extremities, compression stockings in place.    Assessment & Plan:   Problem List Items Addressed This Visit       High   Hypertension - Primary (Chronic)   Blood pressure is well-controlled at 135/70 mmHg. He switched to Hyzaar with no side effects and discussed medication refill issues. Continue Hyzaar and increase the prescription to a 90-day supply for convenience.  Continue with verapamil  and metoprolol  as well, refilled.  Checking BMP today.      Relevant Medications   tadalafil  (CIALIS ) 10 MG tablet   verapamil  (CALAN -SR) 240 MG CR tablet   metoprolol  succinate (TOPROL -XL) 25 MG 24 hr tablet   losartan -hydrochlorothiazide  (HYZAAR) 50-12.5 MG tablet   atorvastatin  (LIPITOR) 80 MG tablet   Other Relevant Orders   Basic metabolic panel with GFR   Diabetes mellitus without complication (HCC) (Chronic)   Diabetes is managed with Jardiance . He reports increased urine output and weight loss. An A1c recheck is planned. Continue Jardiance  and recheck A1c in 2-3 months.      Relevant Medications   empagliflozin  (JARDIANCE ) 10 MG TABS tablet   losartan -hydrochlorothiazide  (HYZAAR) 50-12.5 MG tablet   atorvastatin  (LIPITOR) 80 MG tablet     Medium    Elevated PSA (Chronic)   The manage with urology. Recent MRI showed prostate enlargement and elevated PSA from 4 to 6. Conservative treatment options and surgical risks were discussed, emphasizing the low-grade nature and potential for good outcomes. A biopsy will be scheduled to  assess the Gleason score. Consider conservative treatment options if the Gleason score is low and PSA rises slowly.      Relevant Medications   tamsulosin  (FLOMAX ) 0.4 MG CAPS capsule   Hyperlipidemia (Chronic)   Relevant Medications   tadalafil  (CIALIS ) 10 MG tablet   verapamil  (CALAN -SR) 240 MG CR tablet   metoprolol  succinate (TOPROL -XL) 25 MG 24 hr tablet    losartan -hydrochlorothiazide  (HYZAAR) 50-12.5 MG tablet   atorvastatin  (LIPITOR) 80 MG tablet   Other Relevant Orders   Lipid panel    Cleatus Debby Specking, MD [facility]   "

## 2024-08-25 NOTE — Patient Instructions (Signed)
" °  VISIT SUMMARY: Today, you came in for a follow-up visit to discuss your hypertension, benign prostatic hyperplasia (BPH), and type 2 diabetes. We reviewed your current medications and their effects, as well as your recent MRI and PSA levels. We also discussed your active lifestyle and plans for managing your healthcare during your transition to [place] .  YOUR PLAN: -BENIGN PROSTATIC HYPERPLASIA (BPH): BPH is a condition where the prostate gland is enlarged, causing urinary symptoms. Your symptoms are well-managed with Flomax , so you should continue taking it as prescribed.  - PSA, ACTIVE SURVEILLANCE: Your recent MRI showed an enlarged prostate and elevated PSA levels. We discussed conservative treatment options and the potential risks of surgery. A biopsy will be scheduled to assess the Gleason score, which will help determine the best course of action.  -TYPE 2 DIABETES MELLITUS: Type 2 diabetes is a condition where your body does not use insulin properly. Your diabetes is currently managed with Jardiance , which has caused increased urine output and weight loss. We will recheck your A1c levels in 2-3 months to monitor your condition.  -PRIMARY HYPERTENSION: Hypertension is high blood pressure. Your blood pressure is well-controlled with Hyzaar, and you have not experienced any side effects. We will increase your prescription to a 90-day supply for convenience.  INSTRUCTIONS: A biopsy will be scheduled to assess your Gleason score for prostate cancer. We will recheck your A1c levels in 2-3 months to monitor your diabetes. Your Hyzaar prescription will be increased to a 90-day supply for convenience.  "

## 2024-08-25 NOTE — Assessment & Plan Note (Signed)
 Blood pressure is well-controlled at 135/70 mmHg. He switched to Hyzaar with no side effects and discussed medication refill issues. Continue Hyzaar and increase the prescription to a 90-day supply for convenience.  Continue with verapamil  and metoprolol  as well, refilled.  Checking BMP today.

## 2024-08-26 LAB — BASIC METABOLIC PANEL WITH GFR
BUN: 18 mg/dL (ref 6–23)
CO2: 28 meq/L (ref 19–32)
Calcium: 9.3 mg/dL (ref 8.4–10.5)
Chloride: 104 meq/L (ref 96–112)
Creatinine, Ser: 0.98 mg/dL (ref 0.40–1.50)
GFR: 79.74 mL/min
Glucose, Bld: 79 mg/dL (ref 70–99)
Potassium: 3.9 meq/L (ref 3.5–5.1)
Sodium: 139 meq/L (ref 135–145)

## 2024-08-26 LAB — LIPID PANEL
Cholesterol: 122 mg/dL (ref 28–200)
HDL: 35.6 mg/dL — ABNORMAL LOW
LDL Cholesterol: 71 mg/dL (ref 10–99)
NonHDL: 86.21
Total CHOL/HDL Ratio: 3
Triglycerides: 75 mg/dL (ref 10.0–149.0)
VLDL: 15 mg/dL (ref 0.0–40.0)

## 2024-08-29 ENCOUNTER — Ambulatory Visit: Payer: Self-pay | Admitting: Student in an Organized Health Care Education/Training Program

## 2024-08-30 ENCOUNTER — Telehealth: Payer: Self-pay

## 2024-08-30 NOTE — Telephone Encounter (Signed)
 Copied from CRM [id number]. Topic: General - Other >> Aug 30, 2024  1:48 PM Thersia BROCKS wrote: Reason for CRM: Dena from [facility] called in regarding patient was unable to reach him as mailbox is full wanted to let patient know  Patient insurance will be [employer] and want to verify with patient   Reference number: [id number]  [id number]

## 2024-08-31 ENCOUNTER — Other Ambulatory Visit: Payer: Self-pay

## 2024-08-31 DIAGNOSIS — E782 Mixed hyperlipidemia: Secondary | ICD-10-CM

## 2024-08-31 DIAGNOSIS — E119 Type 2 diabetes mellitus without complications: Secondary | ICD-10-CM

## 2024-08-31 DIAGNOSIS — I10 Essential (primary) hypertension: Secondary | ICD-10-CM

## 2024-08-31 DIAGNOSIS — R972 Elevated prostate specific antigen [PSA]: Secondary | ICD-10-CM

## 2024-08-31 MED ORDER — ATORVASTATIN CALCIUM 80 MG PO TABS: 80.0000 mg | ORAL_TABLET | Freq: Every evening | ORAL | 3 refills | Status: AC

## 2024-08-31 MED ORDER — EMPAGLIFLOZIN 10 MG PO TABS: 10.0000 mg | ORAL_TABLET | Freq: Every day | ORAL | 3 refills | Status: AC

## 2024-08-31 MED ORDER — TADALAFIL 10 MG PO TABS: 10.0000 mg | ORAL_TABLET | Freq: Every day | ORAL | 3 refills | Status: AC | PRN

## 2024-08-31 MED ORDER — TAMSULOSIN HCL 0.4 MG PO CAPS: 0.4000 mg | ORAL_CAPSULE | Freq: Every day | ORAL | 3 refills | Status: AC

## 2024-08-31 MED ORDER — LOSARTAN POTASSIUM-HCTZ 50-12.5 MG PO TABS: 1.0000 | ORAL_TABLET | Freq: Every day | ORAL | 3 refills | Status: AC

## 2024-08-31 MED ORDER — METOPROLOL SUCCINATE ER 25 MG PO TB24: 25.0000 mg | ORAL_TABLET | Freq: Every morning | ORAL | 3 refills | Status: AC

## 2024-08-31 MED ORDER — VERAPAMIL HCL ER 240 MG PO TBCR: 240.0000 mg | EXTENDED_RELEASE_TABLET | Freq: Every day | ORAL | 3 refills | Status: AC

## 2024-08-31 NOTE — Telephone Encounter (Unsigned)
 Copied from CRM #[id number]. Topic: Clinical - Medication Question >> Aug 31, 2024 11:00 AM Berneda FALCON wrote: Reason for CRM: Patient returning phone call to [facility], Wanted to let her know that he is fine with using [facility] for his medication prescription.

## 2024-08-31 NOTE — Telephone Encounter (Signed)
 Will send a mychart message to patient. With the following message below  Copied from CRM [id number]. Topic: Clinical - Prescription Issue >> Aug 31, 2024 10:19 AM Billy Dalton wrote: Reason for CRM: Cecelly from [facility] called regarding prescriptions that were sent 08/25/24 - they cannot get in touch with the patient (mailbox is full) so they won't be able to fill medications. Since patient has [employer], they recommend the office to use [facility] for prescriptions since it will be covered, Also recommended the office to reach out to patient via MYChart since they cannot reach him by phone. Please advise.

## 2024-11-23 ENCOUNTER — Ambulatory Visit: Payer: PRIVATE HEALTH INSURANCE | Admitting: Student in an Organized Health Care Education/Training Program
